# Patient Record
Sex: Female | Born: 1965 | Hispanic: No | Marital: Single | Smoking: Never smoker
Health system: Southern US, Community
[De-identification: ages and names within clinical notes are randomized; demographics above are authoritative.]

## PROBLEM LIST (undated history)

## (undated) DIAGNOSIS — E7800 Pure hypercholesterolemia, unspecified: Secondary | ICD-10-CM

## (undated) DIAGNOSIS — G43909 Migraine, unspecified, not intractable, without status migrainosus: Secondary | ICD-10-CM

## (undated) DIAGNOSIS — I10 Essential (primary) hypertension: Secondary | ICD-10-CM

## (undated) HISTORY — DX: Essential (primary) hypertension: I10

## (undated) HISTORY — DX: Pure hypercholesterolemia, unspecified: E78.00

## (undated) HISTORY — PX: TUBAL LIGATION: SHX77

---

## 1999-04-06 ENCOUNTER — Inpatient Hospital Stay (HOSPITAL_COMMUNITY): Admission: AD | Admit: 1999-04-06 | Discharge: 1999-04-06 | Payer: Self-pay | Admitting: Obstetrics

## 2009-07-12 ENCOUNTER — Emergency Department (HOSPITAL_COMMUNITY): Admission: EM | Admit: 2009-07-12 | Discharge: 2009-07-12 | Payer: Self-pay | Admitting: Emergency Medicine

## 2014-02-24 ENCOUNTER — Other Ambulatory Visit (HOSPITAL_COMMUNITY): Payer: Self-pay | Admitting: *Deleted

## 2014-02-24 DIAGNOSIS — N644 Mastodynia: Secondary | ICD-10-CM

## 2014-03-10 ENCOUNTER — Other Ambulatory Visit: Payer: Self-pay

## 2014-03-10 ENCOUNTER — Ambulatory Visit (HOSPITAL_COMMUNITY): Payer: Self-pay

## 2014-03-19 ENCOUNTER — Ambulatory Visit: Payer: Self-pay | Attending: Internal Medicine

## 2014-04-14 ENCOUNTER — Encounter (HOSPITAL_COMMUNITY): Payer: Self-pay

## 2014-04-14 ENCOUNTER — Ambulatory Visit (HOSPITAL_COMMUNITY)
Admission: RE | Admit: 2014-04-14 | Discharge: 2014-04-14 | Disposition: A | Discharge status: Home / Self Care | Payer: No Typology Code available for payment source | Source: Ambulatory Visit | Attending: Obstetrics and Gynecology | Admitting: Obstetrics and Gynecology

## 2014-04-14 ENCOUNTER — Ambulatory Visit
Admission: RE | Admit: 2014-04-14 | Discharge: 2014-04-14 | Disposition: A | Discharge status: Home / Self Care | Payer: No Typology Code available for payment source | Source: Ambulatory Visit | Attending: Obstetrics and Gynecology | Admitting: Obstetrics and Gynecology

## 2014-04-14 VITALS — BP 116/64 | Temp 98.4°F | Ht 62.0 in | Wt 150.8 lb

## 2014-04-14 DIAGNOSIS — N644 Mastodynia: Secondary | ICD-10-CM

## 2014-04-14 DIAGNOSIS — Z1239 Encounter for other screening for malignant neoplasm of breast: Secondary | ICD-10-CM

## 2014-04-14 HISTORY — DX: Migraine, unspecified, not intractable, without status migrainosus: G43.909

## 2014-04-14 NOTE — Progress Notes (Signed)
Complaints of left breast swelling and pain that comes and goes. Patient rated pain at a 5 out of 10.  Pap Smear: Pap smear not completed today. Last Pap smear was in October 2013 and normal per patient. Per patient has no history of an abnormal Pap smear. No Pap smear results in EPIC.  Physical exam: Breasts Left breast larger than right breast. Per patient her left breast has always been larger than her right breast. No skin abnormalities bilateral breasts. Bilateral nipple retraction per patient has always been that way. No nipple discharge bilateral breasts. No lymphadenopathy. No lumps palpated bilateral breasts. Complaints of left outer breast tenderness on exam. [facility]. Appointment scheduled for Tuesday, April 14, 2014 at 1115.    Pelvic/Bimanual No Pap smear completed today since last Pap smear was in October 2013 per patient. Pap smear not indicated per BCCCP guidelines.

## 2014-04-14 NOTE — Patient Instructions (Signed)
Explained to  Heidi Rogers that she did not need a Pap smear today due to last Pap smear was in October 2013 per patient. Let her know BCCCP will cover Pap smears every 3 years unless has a history of abnormal Pap smears. [facility] Appointment scheduled for Tuesday, April 14, 2014 at 1115. Patient aware of appointment and will be there. Heidi Baaslsa Y Rogers verbalized understanding.  [place], Kathaleen Maserhristine Poll, RN 11:02 AM

## 2014-04-16 ENCOUNTER — Ambulatory Visit (HOSPITAL_BASED_OUTPATIENT_CLINIC_OR_DEPARTMENT_OTHER): Payer: Self-pay

## 2014-04-16 ENCOUNTER — Other Ambulatory Visit: Payer: Self-pay

## 2014-04-16 VITALS — BP 120/72 | HR 66 | Temp 98.2°F | Resp 14 | Ht 59.0 in | Wt 156.0 lb

## 2014-04-16 DIAGNOSIS — Z0000 Encounter for general adult medical examination without abnormal findings: Secondary | ICD-10-CM

## 2014-04-16 LAB — LIPID PANEL
CHOLESTEROL: 194 mg/dL (ref 0–200)
HDL: 38 mg/dL — ABNORMAL LOW (ref >39)
LDL CALC: 129 mg/dL — AB (ref 0–99)
TRIGLYCERIDES: 137 mg/dL (ref <150)
Total CHOL/HDL Ratio: 5.1 Ratio
VLDL: 27 mg/dL (ref 0–40)

## 2014-04-16 LAB — HEMOGLOBIN A1C
Hgb A1c MFr Bld: 5.7 % — ABNORMAL HIGH (ref <5.7)
Mean Plasma Glucose: 117 mg/dL — ABNORMAL HIGH (ref <117)

## 2014-04-16 LAB — GLUCOSE (CC13): Glucose: 108 mg/dl (ref 70–140)

## 2014-04-16 NOTE — Progress Notes (Signed)
[facility] and is currently a B[facility]CP patient effective 04/14/2014 with an interpreter.   Clinical Measurements: Patient is 4 ft.11 inches, weight 156 lbs, waist circumference 35 inches, and hip circumference 39 inches.   Medical History: Patient has no history of high cholesterol. Patient does not have a history of hypertension or diabetes. Per patient no diagnosed history of coronary heart disease, heart attack, heart failure, stroke/TIA, vascular disease or congenital heart defects.   Blood Pressure, Self-measurement: Patient states has no reason to check Blood pressure.  Nutrition Assessment: Patient stated that eats 3 fruits every day. Patient states she eats one to 1 servings of vegetables a day. Per patient does not  eat 3 or more ounces of whole grains daily. Patient doesn't eat two or more servings of fish weekly. Patient states eats twice a month. Patient states she does not drink more than 36 ounces or 450 calories of beverages with added sugars weekly. Patient states she drinks a lot of water. Patient stated she does watch her salt intake.  Physical Activity Assessment: Patient stated she does around 730 minutes of moderate activity by walking and cleaning house per week. Per patient does not do vigorous exercise.  Smoking Status: Patient states has never smoked and is not around smoke.  Quality of Life Assessment: In assessing patient's quality of life she stated that out of the past 30 days that she has felt her health is good all of them. Patient also stated that in the past 30 days that her mental health was good including stress, depression and problems with emotions for all days. Patient did state that out of the past 30 days she felt her physical or mental health had not kept her from doing her usual activities including self-care, work or recreation.   Plan: Lab work will be done today including a lipid panel, blood glucose, and Hgb A1C.  Will call lab results when they are finished. Will discuss any follow up health coaching, community or life style program when called.

## 2014-04-16 NOTE — Patient Instructions (Signed)
Discussed health assessment with patient. Will decide on programs she wants when call lab results. Let patient know that will call her on Wednesday with lab results and [facility]. Informed not to let Health and wellness do any labs or procedures until goes to eligibility and get orange card or patient assistance. Patient verbalized understanding.

## 2014-04-17 ENCOUNTER — Telehealth: Payer: Self-pay

## 2014-04-17 NOTE — Telephone Encounter (Signed)
Call to give lab results and schedule for health coaching.

## 2014-04-29 ENCOUNTER — Telehealth: Payer: Self-pay

## 2014-04-29 NOTE — Telephone Encounter (Signed)
Called to inform about lab work from 02/27/14. Interpreter informed patient: cholesterol- 194, HDL- 38, LDL- 129, triglycerides - 161137, Bld Glucose -108 and HBG-A1C - 5.7.  Set up Health Coaching for 10/29 at 9 AM at [facility] for nutrition, HDL, LDL, and possible [facility].

## 2014-04-30 ENCOUNTER — Ambulatory Visit: Payer: Self-pay

## 2014-04-30 DIAGNOSIS — Z789 Other specified health status: Secondary | ICD-10-CM

## 2014-04-30 NOTE — Progress Notes (Signed)
Patient returns today for Health Coaching regarding Nutrition for her borderline AIC, HDL, LDL and activity with interpreter.   NUTRITION: Patient and I went over Know Your Health Numbers again.Patient reviewed A1C handout to see about prediabetes, normal and  diabetes range. Patient went over what prediabetes is, complications that can result if do not get levels to normal, and diabetes level. Discussed increasing fiber in diet, reading labels and serving sizes.  Discussed watching carbohydrates, how to count them, serving sizes and number of carbs per day allowance. Used plastic serving sizes to see portion sizes and what were starches, fruit and vegetables. Patient stated that she knew she was eating too much beans .  Patient went over 1,600 cal diet and we broke it down to the number of servings she could have. Patient received and reviewed the following handouts in Spanish: Carb counting Menu, A1C Exam, 8 ways to improve your cholesterol, and Diabetes Meal Plan Basics,   Gave patient measuring cup to measure serving sizes and demonstrated the serving sizes. Patient received cookbook in Spanish, lunch bag and bag.  ACTIVITY: Discussed activity and walking. Received and reviewed Exercise and Physical Activity [employer] book in Spanish.  Miscellaneous: Discussed that I would be following up with her 3 more times because of Health Coaching ([facility]), and would rescreen next year.  PLAN:  Increasing amount of walking per day and add other exercises to plan, Decrease carbohydrates in diet. Lose weight. Follow up times three per phone unless needs and can call.

## 2014-04-30 NOTE — Patient Instructions (Signed)
Patient will follow 1600 calorie diet plan. Will review all handouts and exercise/activity book. Will increase exercise. Will measure portion sizes. Will call if has any questions. Will call patient in three weeks. Patient verbalized understanding. 

## 2014-05-04 ENCOUNTER — Encounter (HOSPITAL_COMMUNITY): Payer: Self-pay

## 2014-06-01 ENCOUNTER — Encounter: Payer: Self-pay | Admitting: Internal Medicine

## 2014-06-01 ENCOUNTER — Ambulatory Visit: Payer: Self-pay | Attending: Internal Medicine | Admitting: Internal Medicine

## 2014-06-01 ENCOUNTER — Ambulatory Visit (HOSPITAL_BASED_OUTPATIENT_CLINIC_OR_DEPARTMENT_OTHER): Payer: Self-pay

## 2014-06-01 VITALS — BP 126/82 | HR 75 | Temp 98.0°F | Resp 16 | Wt 143.4 lb

## 2014-06-01 DIAGNOSIS — Z23 Encounter for immunization: Secondary | ICD-10-CM | POA: Insufficient documentation

## 2014-06-01 DIAGNOSIS — Z139 Encounter for screening, unspecified: Secondary | ICD-10-CM

## 2014-06-01 DIAGNOSIS — R739 Hyperglycemia, unspecified: Secondary | ICD-10-CM | POA: Insufficient documentation

## 2014-06-01 DIAGNOSIS — R7309 Other abnormal glucose: Secondary | ICD-10-CM

## 2014-06-01 DIAGNOSIS — G43909 Migraine, unspecified, not intractable, without status migrainosus: Secondary | ICD-10-CM | POA: Insufficient documentation

## 2014-06-01 DIAGNOSIS — G43809 Other migraine, not intractable, without status migrainosus: Secondary | ICD-10-CM | POA: Insufficient documentation

## 2014-06-01 LAB — CBC WITH DIFFERENTIAL/PLATELET
BASOS ABS: 0.1 10*3/uL (ref 0.0–0.1)
Basophils Relative: 1 % (ref 0–1)
EOS ABS: 0.2 10*3/uL (ref 0.0–0.7)
Eosinophils Relative: 2 % (ref 0–5)
HEMATOCRIT: 40.4 % (ref 36.0–46.0)
Hemoglobin: 13.7 g/dL (ref 12.0–15.0)
LYMPHS ABS: 2.1 10*3/uL (ref 0.7–4.0)
LYMPHS PCT: 26 % (ref 12–46)
MCH: 27.9 pg (ref 26.0–34.0)
MCHC: 33.9 g/dL (ref 30.0–36.0)
MCV: 82.3 fL (ref 78.0–100.0)
MONOS PCT: 8 % (ref 3–12)
MPV: 11.7 fL (ref 9.4–12.4)
Monocytes Absolute: 0.7 10*3/uL (ref 0.1–1.0)
NEUTROS PCT: 63 % (ref 43–77)
Neutro Abs: 5.2 10*3/uL (ref 1.7–7.7)
Platelets: 249 10*3/uL (ref 150–400)
RBC: 4.91 MIL/uL (ref 3.87–5.11)
RDW: 13.6 % (ref 11.5–15.5)
WBC: 8.2 10*3/uL (ref 4.0–10.5)

## 2014-06-01 LAB — GLUCOSE, POCT (MANUAL RESULT ENTRY): POC Glucose: 115 mg/dL — AB (ref 70–99)

## 2014-06-01 MED ORDER — SUMATRIPTAN SUCCINATE 50 MG PO TABS: 50.0000 mg | ORAL_TABLET | ORAL | Status: DC | PRN

## 2014-06-01 NOTE — Progress Notes (Signed)
Interpreter line used Patient here to establish care Complains of having headaches that when she gets them cause her tongue to be numb And some numbness to her right hand Recently seen in the ED and was told her sugar was high Today blood sugar is 115

## 2014-06-01 NOTE — Patient Instructions (Signed)
Fat and Cholesterol Control Diet Fat and cholesterol levels in your blood and organs are influenced by your diet. High levels of fat and cholesterol may lead to diseases of the heart, small and large blood vessels, gallbladder, liver, and pancreas. CONTROLLING FAT AND CHOLESTEROL WITH DIET Although exercise and lifestyle factors are important, your diet is key. That is because certain foods are known to raise cholesterol and others to lower it. The goal is to balance foods for their effect on cholesterol and more importantly, to replace saturated and trans fat with other types of fat, such as monounsaturated fat, polyunsaturated fat, and omega-3 fatty acids. On average, a person should consume no more than 15 to 17 g of saturated fat daily. Saturated and trans fats are considered "bad" fats, and they will raise LDL cholesterol. Saturated fats are primarily found in animal products such as meats, butter, and cream. However, that does not mean you need to give up all your favorite foods. Today, there are good tasting, low-fat, low-cholesterol substitutes for most of the things you like to eat. Choose low-fat or nonfat alternatives. Choose round or loin cuts of red meat. These types of cuts are lowest in fat and cholesterol. Chicken (without the skin), fish, veal, and ground turkey breast are great choices. Eliminate fatty meats, such as hot dogs and salami. Even shellfish have little or no saturated fat. Have a 3 oz (85 g) portion when you eat lean meat, poultry, or fish. Trans fats are also called "partially hydrogenated oils." They are oils that have been scientifically manipulated so that they are solid at room temperature resulting in a longer shelf life and improved taste and texture of foods in which they are added. Trans fats are found in stick margarine, some tub margarines, cookies, crackers, and baked goods.  When baking and cooking, oils are a great substitute for butter. The monounsaturated oils are  especially beneficial since it is believed they lower LDL and raise HDL. The oils you should avoid entirely are saturated tropical oils, such as coconut and palm.  Remember to eat a lot from food groups that are naturally free of saturated and trans fat, including fish, fruit, vegetables, beans, grains (barley, rice, couscous, bulgur wheat), and pasta (without cream sauces).  IDENTIFYING FOODS THAT LOWER FAT AND CHOLESTEROL  Soluble fiber may lower your cholesterol. This type of fiber is found in fruits such as apples, vegetables such as broccoli, potatoes, and carrots, legumes such as beans, peas, and lentils, and grains such as barley. Foods fortified with plant sterols (phytosterol) may also lower cholesterol. You should eat at least 2 g per day of these foods for a cholesterol lowering effect.  Read package labels to identify low-saturated fats, trans fat free, and low-fat foods at the supermarket. Select cheeses that have only 2 to 3 g saturated fat per ounce. Use a heart-healthy tub margarine that is free of trans fats or partially hydrogenated oil. When buying baked goods (cookies, crackers), avoid partially hydrogenated oils. Breads and muffins should be made from whole grains (whole-wheat or whole oat flour, instead of "flour" or "enriched flour"). Buy non-creamy canned soups with reduced salt and no added fats.  FOOD PREPARATION TECHNIQUES  Never deep-fry. If you must fry, either stir-fry, which uses very little fat, or use non-stick cooking sprays. When possible, broil, bake, or roast meats, and steam vegetables. Instead of putting butter or margarine on vegetables, use lemon and herbs, applesauce, and cinnamon (for squash and sweet potatoes). Use nonfat   yogurt, salsa, and low-fat dressings for salads.  LOW-SATURATED FAT / LOW-FAT FOOD SUBSTITUTES Meats / Saturated Fat (g)  Avoid: Steak, marbled (3 oz/85 g) / 11 g  Choose: Steak, lean (3 oz/85 g) / 4 g  Avoid: Hamburger (3 oz/85 g) / 7  g  Choose: Hamburger, lean (3 oz/85 g) / 5 g  Avoid: Ham (3 oz/85 g) / 6 g  Choose: Ham, lean cut (3 oz/85 g) / 2.4 g  Avoid: Chicken, with skin, dark meat (3 oz/85 g) / 4 g  Choose: Chicken, skin removed, dark meat (3 oz/85 g) / 2 g  Avoid: Chicken, with skin, light meat (3 oz/85 g) / 2.5 g  Choose: Chicken, skin removed, light meat (3 oz/85 g) / 1 g Dairy / Saturated Fat (g)  Avoid: Whole milk (1 cup) / 5 g  Choose: Low-fat milk, 2% (1 cup) / 3 g  Choose: Low-fat milk, 1% (1 cup) / 1.5 g  Choose: Skim milk (1 cup) / 0.3 g  Avoid: Hard cheese (1 oz/28 g) / 6 g  Choose: Skim milk cheese (1 oz/28 g) / 2 to 3 g  Avoid: Cottage cheese, 4% fat (1 cup) / 6.5 g  Choose: Low-fat cottage cheese, 1% fat (1 cup) / 1.5 g  Avoid: Ice cream (1 cup) / 9 g  Choose: Sherbet (1 cup) / 2.5 g  Choose: Nonfat frozen yogurt (1 cup) / 0.3 g  Choose: Frozen fruit bar / trace  Avoid: Whipped cream (1 tbs) / 3.5 g  Choose: Nondairy whipped topping (1 tbs) / 1 g Condiments / Saturated Fat (g)  Avoid: Mayonnaise (1 tbs) / 2 g  Choose: Low-fat mayonnaise (1 tbs) / 1 g  Avoid: Butter (1 tbs) / 7 g  Choose: Extra light margarine (1 tbs) / 1 g  Avoid: Coconut oil (1 tbs) / 11.8 g  Choose: Olive oil (1 tbs) / 1.8 g  Choose: Corn oil (1 tbs) / 1.7 g  Choose: Safflower oil (1 tbs) / 1.2 g  Choose: Sunflower oil (1 tbs) / 1.4 g  Choose: Soybean oil (1 tbs) / 2.4 g  Choose: Canola oil (1 tbs) / 1 g Document Released: 06/19/2005 Document Revised: 10/14/2012 Document Reviewed: 09/17/2013 ExitCare Patient Information 2015 [place], [employer]. This information is not intended to replace advice given to you by your health care provider. Make sure you discuss any questions you have with your health care provider. La diabetes mellitus y los alimentos (Diabetes Mellitus and Food) Es importante que controle su nivel de azcar en la sangre (glucosa). El nivel de glucosa en sangre depende en gran  medida de lo que usted come. Comer alimentos saludables en las cantidades [place]adecuadas a lo largo del Futures traderda, aproximadamente a la misma hora CarMaxtodos los das, lo ayudar a Chief Operating Officercontrolar su nivel de Event organiserglucosa en sangre. Tambin puede ayudarlo a retrasar o Fish farm managerevitar el empeoramiento de la diabetes mellitus. Comer de Regions Financial Corporationmanera saludable incluso puede ayudarlo a Event organisermejorar el nivel de presin arterial y a Baristaalcanzar o Pharmacologistmantener un peso saludable.  CMO PUEDEN AFECTARME LOS ALIMENTOS? Carbohidratos Los carbohidratos afectan el nivel de glucosa en sangre ms que cualquier otro tipo de alimento. El nutricionista lo ayudar a Chief Strategy Officerdeterminar cuntos carbohidratos puede consumir en cada comida y ensearle a contarlos. El recuento de carbohidratos es importante para mantener la glucosa en sangre en un nivel saludable, en especial si utiliza insulina o toma determinados medicamentos para la diabetes mellitus. Alcohol El alcohol puede provocar disminuciones sbitas de  la glucosa en sangre (hipoglucemia), en especial si utiliza insulina o toma determinados medicamentos para la diabetes mellitus. La hipoglucemia es una afeccin que puede poner en peligro la vida. Los sntomas de la hipoglucemia (somnolencia, mareos y Administratordesorientacin) son similares a los sntomas de haber consumido mucho alcohol.  Si el mdico lo autoriza a beber alcohol, hgalo con moderacin y siga estas pautas:  Las mujeres no deben beber ms de un trago por da, y los hombres no deben beber ms de dos tragos por Futures traderda. Un trago es igual a:  12 onzas (355 ml) de cerveza  5 onzas de vino (150 ml) de vino  1,5onzas (45ml) de bebidas espirituosas  No beba con el estmago vaco.  Mantngase hidratado. Beba agua, gaseosas dietticas o t helado sin azcar.  Las gaseosas comunes, los jugos y otros refrescos podran contener muchos carbohidratos y se Heritage managerdeben contar. QU ALIMENTOS NO SE RECOMIENDAN? Cuando haga las elecciones de alimentos, es importante que recuerde que todos  los alimentos son distintos. Algunos tienen menos nutrientes que otros por porcin, aunque podran tener la misma cantidad de caloras o carbohidratos. Es difcil darle al cuerpo lo que necesita cuando consume alimentos con menos nutrientes. Estos son algunos ejemplos de alimentos que debera evitar ya que contienen muchas caloras y carbohidratos, pero pocos nutrientes:  NeurosurgeonGrasas trans (la mayora de los alimentos procesados incluyen grasas trans en la etiqueta de Informacin nutricional).  Gaseosas comunes.  Jugos.  Caramelos.  Dulces, como tortas, pasteles, rosquillas y Graygalletas.  Comidas fritas. QU ALIMENTOS PUEDO COMER? Consuma alimentos ricos en nutrientes, que nutrirn el cuerpo y lo mantendrn saludable. Los alimentos que debe comer tambin dependern de varios factores, como:  Las caloras que necesita.  Los medicamentos que toma.  Su peso.  El nivel de glucosa en Metzgersangre.  El [place] Springsnivel de presin arterial.  El nivel de colesterol. Tambin debe consumir una variedad de [place]alimentos, como:  Protenas, como carne, aves, pescado, tofu, frutos secos y semillas (las protenas de Boardmananimales magros son mejores).  Nils PyleFrutas.  Verduras.  Productos lcteos, como [place]le[place], queso y yogur (descremados son mejores).  Panes, granos, pastas, cereales, arroz y frijoles.  Grasas, como aceite de Padenoliva, Indiamargarina sin grasas trans, aceite de canola, aguacate y Toppenishaceitunas. TODOS LOS QUE PADECEN DIABETES MELLITUS TIENEN EL MISMO PLAN DE COMIDAS? Dado que todas las personas que padecen diabetes mellitus son distintas, no hay un solo plan de comidas que funcione para todos. Es muy importante que se rena con un nutricionista que lo ayudar a crear un plan de comidas adecuado para usted. Document Released: 09/26/2007 Document Revised: 06/24/2013 [facility] Patient Information 2015 [place], [place]. This information is not intended to replace advice given to you by your health care provider. Make sure you  discuss any questions you have with your health care provider.

## 2014-06-01 NOTE — Progress Notes (Signed)
Patient Demographics  Heidi Rogers, is a 48 y.o. female  AOZ:[id number]  [id number]  DOB - [birth year]  CC:  Chief Complaint  Patient presents with  . Establish Care       HPI: Heidi Rogers is a 48 y.o. female here today to establish medical care.chief history of migraine headaches as per patient she has been taking Excedrin Migraine and she took last night typically , the episode typically lasts for 1-2 hours which is associated with nausea and vomiting, she currently denies any headache dizziness chest and shortness of breath, denies smoking cigarettes, alcohol or illicit drugs,patient is up-to-date with her mammogram had a Pap smear done last year, recently she had blood work done and was told her sugar is elevated, noticed hemoglobin A1c of 5.7%. Patient denies any family history of diabetes. Patient has No headache, No chest pain, No abdominal pain - No Nausea, No new weakness tingling or numbness, No Cough - SOB.  No Known Allergies Past Medical History  Diagnosis Date  . Migraines    No current outpatient prescriptions on file prior to visit.   No current facility-administered medications on file prior to visit.   Family History  Problem Relation Age of Onset  . Migraines     History   Social History  . Marital Status: Single    Spouse Name: N/A    Number of Children: N/A  . Years of Education: N/A   Occupational History  . Not on file.   Social History Main Topics  . Smoking status: Never Smoker   . Smokeless tobacco: Never Used  . Alcohol Use: No  . Drug Use: No  . Sexual Activity: Yes    Birth Control/ Protection: Surgical   Other Topics Concern  . Not on file   Social History Narrative    Review of Systems: Constitutional: Negative for fever, chills, diaphoresis, activity change, appetite change and fatigue. HENT: Negative for ear pain, nosebleeds, congestion, facial swelling, rhinorrhea, neck pain, neck stiffness and ear discharge.  Eyes:  Negative for pain, discharge, redness, itching and visual disturbance. Respiratory: Negative for cough, choking, chest tightness, shortness of breath, wheezing and stridor.  Cardiovascular: Negative for chest pain, palpitations and leg swelling. Gastrointestinal: Negative for abdominal distention. Genitourinary: Negative for dysuria, urgency, frequency, hematuria, flank pain, decreased urine volume, difficulty urinating and dyspareunia.  Musculoskeletal: Negative for back pain, joint swelling, arthralgia and gait problem. Neurological: Negative for dizziness, tremors, seizures, syncope, facial asymmetry, speech difficulty, weakness, light-headedness, numbness and headaches.  Hematological: Negative for adenopathy. Does not bruise/bleed easily. Psychiatric/Behavioral: Negative for hallucinations, behavioral problems, confusion, dysphoric mood, decreased concentration and agitation.    Objective:   Filed Vitals:   06/01/14 1437  BP: 126/82  Pulse: 75  Temp: 98 F (36.7 C)  Resp: 16    Physical Exam: Constitutional: Patient appears well-developed and well-nourished. No distress. HENT: Normocephalic, atraumatic, External right and left ear normal. Oropharynx is clear and moist.  Eyes: Conjunctivae and EOM are normal. PERRLA, no scleral icterus. Neck: Normal ROM. Neck supple. No JVD. No tracheal deviation. No thyromegaly. CVS: RRR, S1/S2 +, no murmurs, no gallops, no carotid bruit.  Pulmonary: Effort and breath sounds normal, no stridor, rhonchi, wheezes, rales.  Abdominal: Soft. BS +, no distension, tenderness, rebound or guarding.  Musculoskeletal: Normal range of motion. No edema and no tenderness.  Neuro: Alert. Normal reflexes, muscle tone coordination. No cranial nerve deficit. Skin: Skin is warm and dry. No rash noted. Not diaphoretic. No  erythema. No pallor. Psychiatric: Normal mood and affect. Behavior, judgment, thought content normal.  No results found for: WBC, HGB, HCT,  MCV, PLT No results found for: CREATININE, BUN, NA, K, CL, CO2  Lab Results  Component Value Date   HGBA1C 5.7* 04/16/2014   Lipid Panel     Component Value Date/Time   CHOL 194 04/16/2014 0925   TRIG 137 04/16/2014 0925   HDL 38* 04/16/2014 0925   CHOLHDL 5.1 04/16/2014 0925   VLDL 27 04/16/2014 0925   LDLCALC 129* 04/16/2014 0925       Assessment and plan:   1. Elevated blood sugar Her recent hemoglobin A1c was 5.7%, I have advised patient for low carbohydrate diet. - Glucose (CBG)  2. Other migraine without status migrainosus, not intractable Patient will continue taking Excedrin Migraine I have prescribed Imitrex to use when necessary for severe headache - SUMAtriptan (IMITREX) 50 MG tablet; Take 1 tablet (50 mg total) by mouth every 2 (two) hours as needed for migraine or headache. May repeat in 2 hours if headache persists or recurs.  Dispense: 10 tablet; Refill: 0  3. Screening Ordered baseline blood work. - CBC with Differential - TSH - Vit D  25 hydroxy (rtn osteoporosis monitoring) - COMPLETE METABOLIC PANEL WITH GFR  4. Needs flu shot Flu shot given today.     Health Maintenance  -Pap Smear: had one last year  -Mammogram: uptodate  -Vaccinations:  Flu shot today   Return in about 3 months (around 08/31/2014) for migraine.  Doris CheadleADVANI, Lenea [place], MD

## 2014-06-02 LAB — COMPLETE METABOLIC PANEL WITH GFR
ALK PHOS: 56 U/L (ref 39–117)
ALT: 12 U/L (ref 0–35)
AST: 12 U/L (ref 0–37)
Albumin: 4.2 g/dL (ref 3.5–5.2)
BUN: 12 mg/dL (ref 6–23)
CO2: 25 meq/L (ref 19–32)
Calcium: 8.8 mg/dL (ref 8.4–10.5)
Chloride: 105 mEq/L (ref 96–112)
Creat: 0.63 mg/dL (ref 0.50–1.10)
GFR, Est African American: >89 mL/min
Glucose, Bld: 99 mg/dL (ref 70–99)
POTASSIUM: 3.8 meq/L (ref 3.5–5.3)
SODIUM: 136 meq/L (ref 135–145)
Total Bilirubin: 0.3 mg/dL (ref 0.2–1.2)
Total Protein: 6.6 g/dL (ref 6.0–8.3)

## 2014-06-02 LAB — VITAMIN D 25 HYDROXY (VIT D DEFICIENCY, FRACTURES): Vit D, 25-Hydroxy: 27 ng/mL — ABNORMAL LOW (ref 30–100)

## 2014-06-02 LAB — TSH: TSH: 2.035 u[IU]/mL (ref 0.350–4.500)

## 2014-06-04 ENCOUNTER — Telehealth: Payer: Self-pay | Admitting: *Deleted

## 2014-06-04 NOTE — Telephone Encounter (Signed)
-----   Message from Doris Cheadleeepak Advani, MD sent at 06/02/2014  1:33 PM EST ----- Blood work reviewed, noticed low vitamin D, advise patient to start taking OTC 2000 units daily.

## 2014-06-04 NOTE — Telephone Encounter (Signed)
Left message to return call 

## 2014-06-05 ENCOUNTER — Telehealth: Payer: Self-pay | Admitting: Internal Medicine

## 2014-06-05 NOTE — Telephone Encounter (Signed)
Pt. Calling to request results, please f/u with pt.

## 2014-11-09 ENCOUNTER — Telehealth: Payer: Self-pay

## 2014-11-09 NOTE — Telephone Encounter (Signed)
Left message. Will call back.

## 2015-01-21 ENCOUNTER — Telehealth: Payer: Self-pay

## 2015-01-22 NOTE — Telephone Encounter (Signed)
Left message that had called and would call again.

## 2017-06-08 ENCOUNTER — Other Ambulatory Visit: Payer: Self-pay

## 2017-06-08 ENCOUNTER — Encounter (HOSPITAL_COMMUNITY): Payer: Self-pay | Admitting: Emergency Medicine

## 2017-06-08 ENCOUNTER — Emergency Department (HOSPITAL_COMMUNITY)
Admission: EM | Admit: 2017-06-08 | Discharge: 2017-06-08 | Disposition: A | Discharge status: Home / Self Care | Payer: Self-pay | Attending: Emergency Medicine | Admitting: Emergency Medicine

## 2017-06-08 DIAGNOSIS — R51 Headache: Secondary | ICD-10-CM | POA: Insufficient documentation

## 2017-06-08 DIAGNOSIS — R519 Headache, unspecified: Secondary | ICD-10-CM

## 2017-06-08 LAB — COMPREHENSIVE METABOLIC PANEL
ALK PHOS: 65 U/L (ref 38–126)
ALT: 16 U/L (ref 14–54)
AST: 17 U/L (ref 15–41)
Albumin: 4.1 g/dL (ref 3.5–5.0)
Anion gap: 6 (ref 5–15)
BUN: 10 mg/dL (ref 6–20)
CALCIUM: 8.6 mg/dL — AB (ref 8.9–10.3)
CHLORIDE: 109 mmol/L (ref 101–111)
CO2: 24 mmol/L (ref 22–32)
CREATININE: 0.45 mg/dL (ref 0.44–1.00)
Glucose, Bld: 134 mg/dL — ABNORMAL HIGH (ref 65–99)
Potassium: 3.8 mmol/L (ref 3.5–5.1)
Sodium: 139 mmol/L (ref 135–145)
Total Bilirubin: 0.7 mg/dL (ref 0.3–1.2)
Total Protein: 7.1 g/dL (ref 6.5–8.1)

## 2017-06-08 LAB — CBC WITH DIFFERENTIAL/PLATELET
Basophils Absolute: 0 10*3/uL (ref 0.0–0.1)
Basophils Relative: 0 %
EOS PCT: 0 %
Eosinophils Absolute: 0 10*3/uL (ref 0.0–0.7)
HCT: 41.9 % (ref 36.0–46.0)
Hemoglobin: 14.2 g/dL (ref 12.0–15.0)
LYMPHS ABS: 1.2 10*3/uL (ref 0.7–4.0)
LYMPHS PCT: 9 %
MCH: 29 pg (ref 26.0–34.0)
MCHC: 33.9 g/dL (ref 30.0–36.0)
MCV: 85.7 fL (ref 78.0–100.0)
MONOS PCT: 4 %
Monocytes Absolute: 0.5 10*3/uL (ref 0.1–1.0)
Neutro Abs: 12.4 10*3/uL — ABNORMAL HIGH (ref 1.7–7.7)
Neutrophils Relative %: 87 %
PLATELETS: 231 10*3/uL (ref 150–400)
RBC: 4.89 MIL/uL (ref 3.87–5.11)
RDW: 12.5 % (ref 11.5–15.5)
WBC: 14.2 10*3/uL — AB (ref 4.0–10.5)

## 2017-06-08 LAB — I-STAT CG4 LACTIC ACID, ED: Lactic Acid, Venous: 0.56 mmol/L (ref 0.5–1.9)

## 2017-06-08 LAB — I-STAT BETA HCG BLOOD, ED (MC, WL, AP ONLY)

## 2017-06-08 MED ORDER — IBUPROFEN 600 MG PO TABS: 600.0000 mg | ORAL_TABLET | Freq: Four times a day (QID) | ORAL | 0 refills | Status: DC | PRN

## 2017-06-08 MED ORDER — ONDANSETRON 4 MG PO TBDP
4.0000 mg | ORAL_TABLET | Freq: Once | ORAL | Status: AC
  Administered 2017-06-08: 4 mg via ORAL
  Filled 2017-06-08: qty 1

## 2017-06-08 MED ORDER — MECLIZINE HCL 25 MG PO TABS
12.5000 mg | ORAL_TABLET | Freq: Once | ORAL | Status: AC
  Administered 2017-06-08: 12.5 mg via ORAL
  Filled 2017-06-08: qty 1

## 2017-06-08 MED ORDER — DIPHENHYDRAMINE HCL 50 MG/ML IJ SOLN
25.0000 mg | Freq: Once | INTRAMUSCULAR | Status: AC
  Administered 2017-06-08: 25 mg via INTRAVENOUS
  Filled 2017-06-08: qty 1

## 2017-06-08 MED ORDER — SODIUM CHLORIDE 0.9 % IV BOLUS (SEPSIS)
1000.0000 mL | Freq: Once | INTRAVENOUS | Status: AC
  Administered 2017-06-08: 1000 mL via INTRAVENOUS

## 2017-06-08 MED ORDER — PROCHLORPERAZINE EDISYLATE 5 MG/ML IJ SOLN
10.0000 mg | Freq: Once | INTRAMUSCULAR | Status: AC
Start: 2017-06-08 — End: 2017-06-08
  Administered 2017-06-08: 10 mg via INTRAVENOUS
  Filled 2017-06-08: qty 2

## 2017-06-08 MED ORDER — KETOROLAC TROMETHAMINE 15 MG/ML IJ SOLN
15.0000 mg | Freq: Once | INTRAMUSCULAR | Status: AC
  Administered 2017-06-08: 15 mg via INTRAVENOUS
  Filled 2017-06-08: qty 1

## 2017-06-08 MED ORDER — ACETAMINOPHEN ER 650 MG PO TBCR: 650.0000 mg | EXTENDED_RELEASE_TABLET | Freq: Three times a day (TID) | ORAL | 0 refills | Status: DC | PRN

## 2017-06-08 NOTE — ED Provider Notes (Signed)
[facility]-EMERGENCY DEPT Provider Note   CSN: [id number] Arrival date & time: 06/08/17  9562     History   Chief Complaint Chief Complaint  Patient presents with  . Headache    HPI Heidi Rogers is a 51 y.o. female.  HPI   [name] is a 51 year old female with a history of migraines, elevated blood sugar who presents emergency department for evaluation of headache and vomiting.  Patient is Spanish-speaking, Cub[place] interpreter used during the encounter.  She states that she has had a headache for the past 4 days now which has gradually worsened.  Headache is located on the top of the head and feels like pressure, "like my head is going to explode."  Pain is 8/10 in severity and worsened with bright lights and loud noises.  Patient states that she has taken ibuprofen without adequate relief.  She reports associated nausea and vomiting for the past 3 days.  She also reports intermittent chills, no fever that she is aware of.  Denies visual disturbance, numbness, weakness, neck pain, cough, abdominal pain, dysuria, urinary frequency, body aches, congestion, sore throat.  Denies recent head trauma.  States that she generally has 2 migraines a month, but this is different because it is lasted longer and has not been improved with ibuprofen.  Past Medical History:  Diagnosis Date  . Migraines     Patient Active Problem List   Diagnosis Date Noted  . Other migraine without status migrainosus, not intractable 06/01/2014  . Elevated blood sugar 06/01/2014  . Pain of left breast 04/14/2014    Past Surgical History:  Procedure Laterality Date  . TUBAL LIGATION      OB History    Gravida Para Term Preterm AB Living   5 5 5     5    SAB TAB Ectopic Multiple Live Births                   Home Medications    Prior to Admission medications   Medication Sig Start Date End Date Taking? Authorizing Provider  ibuprofen (ADVIL,MOTRIN) 600 MG tablet Take 600 mg by  mouth every 6 (six) hours as needed for moderate pain.   Yes [provider]  SUMAtriptan (IMITREX) 50 MG tablet Take 1 tablet (50 mg total) by mouth every 2 (two) hours as needed for migraine or headache. May repeat in 2 hours if headache persists or recurs. 06/01/14   Doris Cheadle, MD    Family History Family History  Problem Relation Age of Onset  . Migraines Unknown     Social History Social History   Tobacco Use  . Smoking status: Never Smoker  . Smokeless tobacco: Never Used  Substance Use Topics  . Alcohol use: No  . Drug use: No     Allergies   Patient has no known allergies.   Review of Systems Review of Systems  Constitutional: Positive for chills. Negative for fatigue and fever.  HENT: Negative for congestion and sore throat.   Eyes: Positive for photophobia. Negative for visual disturbance.  Respiratory: Negative for cough and shortness of breath.   Cardiovascular: Negative for chest pain.  Gastrointestinal: Positive for nausea and vomiting. Negative for abdominal pain.  Genitourinary: Negative for difficulty urinating, dysuria, flank pain and frequency.  Musculoskeletal: Negative for myalgias, neck pain and neck stiffness.  Skin: Negative for rash.  Neurological: Positive for headaches. Negative for dizziness, weakness, light-headedness and numbness.  Psychiatric/Behavioral: Negative for agitation.  Physical Exam Updated Vital Signs BP 138/86 (BP Location: Left Arm)   Pulse 85   Temp 98.6 F (37 C) (Oral)   Resp 18   LMP 05/06/2017   SpO2 96%   Physical Exam  Constitutional: She appears well-developed and well-nourished. No distress.  HENT:  Head: Normocephalic and atraumatic.  Mouth/Throat: Oropharynx is clear and moist.  Eyes: Pupils are equal, round, and reactive to light. Right eye exhibits no discharge. Left eye exhibits no discharge. Right eye exhibits no nystagmus. Left eye exhibits no nystagmus.  Neck: Normal range of  motion. Neck supple.  Negative Brudzinski.  Cardiovascular: Normal rate, regular rhythm and intact distal pulses.  No murmur heard. Pulmonary/Chest: Effort normal and breath sounds normal. No stridor. No respiratory distress. She has no wheezes. She has no rales. She exhibits no tenderness.  Abdominal: Soft. Bowel sounds are normal. She exhibits no distension. There is no tenderness. There is no guarding.  Musculoskeletal: Normal range of motion.  Lymphadenopathy:    She has no cervical adenopathy.  Neurological: She is alert. Coordination normal.  Mental Status:  Alert, oriented, thought content appropriate, able to give a coherent history. Speech fluent without evidence of aphasia. Able to follow 2 step commands without difficulty.  Cranial Nerves:  II:  Peripheral visual fields grossly normal, pupils equal, round, reactive to light III,IV, VI: ptosis not present, extra-ocular motions intact bilaterally  V,VII: smile symmetric, facial light touch sensation equal VIII: hearing grossly normal to voice  X: uvula elevates symmetrically  XI: bilateral shoulder shrug symmetric and strong XII: midline tongue extension without fassiculations Motor:  Normal tone. 5/5 in upper and lower extremities bilaterally including strong and equal grip strength and dorsiflexion/plantar flexion Sensory: Pinprick and light touch normal in all extremities.  Deep Tendon Reflexes: 2+ and symmetric in the biceps and patella Cerebellar: normal finger-to-nose with bilateral upper extremities Gait: normal gait and balance  Skin: Skin is warm and dry. Capillary refill takes less than 2 seconds. She is not diaphoretic.  Psychiatric: She has a normal mood and affect. Her behavior is normal.  Nursing note and vitals reviewed.    ED Treatments / Results  Labs (all labs ordered are listed, but only abnormal results are displayed) Labs Reviewed  CBC WITH DIFFERENTIAL/PLATELET - Abnormal; Notable for the following  components:      Result Value   WBC 14.2 (*)    Neutro Abs 12.4 (*)    All other components within normal limits  COMPREHENSIVE METABOLIC PANEL - Abnormal; Notable for the following components:   Glucose, Bld 134 (*)    Calcium 8.6 (*)    All other components within normal limits  I-STAT BETA HCG BLOOD, ED ([facility], [facility], [facility] ONLY)  I-STAT CG4 LACTIC ACID, ED  I-STAT CG4 LACTIC ACID, ED    EKG  EKG Interpretation None       Radiology No results found.  Procedures Procedures (including critical care time)  Medications Ordered in ED Medications  ketorolac (TORADOL) 15 MG/ML injection 15 mg (not administered)  prochlorperazine (COMPAZINE) injection 10 mg (not administered)  diphenhydrAMINE (BENADRYL) injection 25 mg (not administered)  sodium chloride 0.9 % bolus 1,000 mL (not administered)  ondansetron (ZOFRAN-ODT) disintegrating tablet 4 mg (4 mg Oral Given 06/08/17 0529)     Initial Impression / Assessment and Plan / ED Course  I have reviewed the triage vital signs and the nursing notes.  Pertinent labs & imaging results that were available during my care of the patient were  reviewed by me and considered in my medical decision making (see chart for details).  Clinical Course as of Jun 08 918  Fri Jun 08, 2017  0908 On recheck, patient states that her headache is improved but has a sensation of room spinning which began after receiving Toradol, Compazine and Benadryl.  Will give her meclizine and recheck.  [ES]    Clinical Course User Index [ES] Kellie ShropshireShrosbree, Emily J, PA-C   Presents with ongoing headache for the past 4 days and associated nausea/vomiting, photophobia and phonophobia.  She has a history of migraines in the past.  She is nontoxic-appearing and in no apparent distress.  Her vital signs are stable.  She has no fever or neck pain/stiffness to suggest meningitis.  No focal neurological deficits, exam non concerning for ICH, SAH.   Basic labs reviewed.  Beta hCG  negative.  CMP unremarkable. CBC shows mild leukocytosis with WBC 14.2.  Lactic acid normal.  Do not suspect infectious etiology given presentation.  Her headache improved in the ER.  She is able to tolerate p.o. fluids at the bedside.  She has no complaints prior to discharge.  Her glucose was elevated, counseled her to have this rechecked primary care office and gave her information to establish care at [facility].  Discussed return precautions the patient agrees and voiced understanding.  Discussed this patient with Dr. Silverio LayYao who agrees with the above plan.   Final Clinical Impressions(s) / ED Diagnoses   Final diagnoses:  Acute nonintractable headache, unspecified headache type    ED Discharge Orders    None       Kellie ShropshireShrosbree, Emily J, PA-C 06/08/17 1619    Charlynne PanderYao, David Hsienta, MD 06/09/17 [id number]

## 2017-06-08 NOTE — ED Triage Notes (Signed)
Pt complaint of headache, vomiting, and possible fever ongoing for 4 days. Mask applied with triage.

## 2017-06-08 NOTE — Discharge Instructions (Signed)
You can take Tylenol or ibuprofen as needed for headache in the future.  Your blood sugar was elevated in the ER today, please have this rechecked at primary care office.  I have listed the information to [facility], which is a clinic located across the street from [facility].  The extent patients he did not have insurance.  Please call to establish care and for follow-up.  Return to the emergency department if you have headache with neck pain and fever greater than 100.76F, headache with vomiting that does not stop, headache with numbness in the arms or legs.

## 2018-05-04 ENCOUNTER — Emergency Department (HOSPITAL_COMMUNITY)
Admission: EM | Admit: 2018-05-04 | Discharge: 2018-05-04 | Disposition: A | Discharge status: Home / Self Care | Payer: Self-pay | Attending: Emergency Medicine | Admitting: Emergency Medicine

## 2018-05-04 ENCOUNTER — Emergency Department (HOSPITAL_COMMUNITY): Payer: Self-pay

## 2018-05-04 ENCOUNTER — Other Ambulatory Visit: Payer: Self-pay

## 2018-05-04 ENCOUNTER — Encounter (HOSPITAL_COMMUNITY): Payer: Self-pay | Admitting: Emergency Medicine

## 2018-05-04 DIAGNOSIS — Z79899 Other long term (current) drug therapy: Secondary | ICD-10-CM | POA: Insufficient documentation

## 2018-05-04 DIAGNOSIS — G43001 Migraine without aura, not intractable, with status migrainosus: Secondary | ICD-10-CM

## 2018-05-04 DIAGNOSIS — G43909 Migraine, unspecified, not intractable, without status migrainosus: Secondary | ICD-10-CM | POA: Insufficient documentation

## 2018-05-04 LAB — CBC
HCT: 44.3 % (ref 36.0–46.0)
HEMOGLOBIN: 14.3 g/dL (ref 12.0–15.0)
MCH: 27.7 pg (ref 26.0–34.0)
MCHC: 32.3 g/dL (ref 30.0–36.0)
MCV: 85.7 fL (ref 80.0–100.0)
NRBC: 0 % (ref 0.0–0.2)
Platelets: 247 10*3/uL (ref 150–400)
RBC: 5.17 MIL/uL — AB (ref 3.87–5.11)
RDW: 11.9 % (ref 11.5–15.5)
WBC: 14.1 10*3/uL — AB (ref 4.0–10.5)

## 2018-05-04 LAB — BASIC METABOLIC PANEL
ANION GAP: 6 (ref 5–15)
BUN: 15 mg/dL (ref 6–20)
CO2: 25 mmol/L (ref 22–32)
Calcium: 8.9 mg/dL (ref 8.9–10.3)
Chloride: 106 mmol/L (ref 98–111)
Creatinine, Ser: 0.69 mg/dL (ref 0.44–1.00)
GFR calc Af Amer: >60 mL/min (ref >60)
GLUCOSE: 110 mg/dL — AB (ref 70–99)
POTASSIUM: 4.4 mmol/L (ref 3.5–5.1)
Sodium: 137 mmol/L (ref 135–145)

## 2018-05-04 LAB — I-STAT BETA HCG BLOOD, ED (MC, WL, AP ONLY): I-stat hCG, quantitative: <5 m[IU]/mL (ref <5)

## 2018-05-04 MED ORDER — SODIUM CHLORIDE 0.9 % IV BOLUS
500.0000 mL | Freq: Once | INTRAVENOUS | Status: AC
  Administered 2018-05-04: 500 mL via INTRAVENOUS

## 2018-05-04 MED ORDER — DIPHENHYDRAMINE HCL 50 MG/ML IJ SOLN
50.0000 mg | Freq: Once | INTRAMUSCULAR | Status: DC
  Filled 2018-05-04: qty 1

## 2018-05-04 MED ORDER — DEXAMETHASONE SODIUM PHOSPHATE 10 MG/ML IJ SOLN
10.0000 mg | Freq: Once | INTRAMUSCULAR | Status: AC
  Administered 2018-05-04: 10 mg via INTRAVENOUS
  Filled 2018-05-04: qty 1

## 2018-05-04 MED ORDER — METOCLOPRAMIDE HCL 10 MG PO TABS: 10.0000 mg | ORAL_TABLET | Freq: Four times a day (QID) | ORAL | 0 refills | Status: DC | PRN

## 2018-05-04 MED ORDER — METOCLOPRAMIDE HCL 5 MG/ML IJ SOLN
10.0000 mg | Freq: Once | INTRAMUSCULAR | Status: AC
  Administered 2018-05-04: 10 mg via INTRAVENOUS
  Filled 2018-05-04: qty 2

## 2018-05-04 MED ORDER — KETOROLAC TROMETHAMINE 15 MG/ML IJ SOLN
15.0000 mg | Freq: Once | INTRAMUSCULAR | Status: AC
  Administered 2018-05-04: 15 mg via INTRAVENOUS
  Filled 2018-05-04: qty 1

## 2018-05-04 NOTE — Discharge Instructions (Addendum)
Take Tylenol and Motrin as needed for headaches. You can try Reglan and take it with Benadryl for migraine-like headaches.  Be careful to make you sleepy. Return for strokelike symptoms, recurrent vomiting or worsening or new symptoms.

## 2018-05-04 NOTE — ED Triage Notes (Addendum)
Pt has hx of migraines. States she has had a right sided headache since yesterday. States normally her pain is to the entire head. No weakness. She does endorse bilateral blurry vision and sensitivity to light. She was seen by a doctor yesterday who gave her a prescription for percocet with no relief. Pt pain 10/10. Pt states she had labs drawn yesterday on [address] but does not know the name of her doctor who drew them.

## 2018-05-04 NOTE — ED Notes (Signed)
Patient transported to CT 

## 2018-05-04 NOTE — ED Provider Notes (Signed)
[facility] EMERGENCY DEPARTMENT Provider Note   CSN: [id number] Arrival date & time: 05/04/18  1252     History   Chief Complaint Chief Complaint  Patient presents with  . Headache    HPI Heidi Rogers is a 52 y.o. female.  Patient's had gradually worsening and persistent headache for almost 2 weeks.  Worse in the right side for the past day.  Patient has history of migraines monthly however this is more severe than slightly different.  Patient has mild blurry vision and photophobia.  No fevers or chills.  No neck stiffness.  Interpreter used.  No head injury or new medications.  No history of aneurysms.  Located central and frontal.     Past Medical History:  Diagnosis Date  . Migraines     Patient Active Problem List   Diagnosis Date Noted  . Other migraine without status migrainosus, not intractable 06/01/2014  . Elevated blood sugar 06/01/2014  . Pain of left breast 04/14/2014    Past Surgical History:  Procedure Laterality Date  . TUBAL LIGATION       OB History    Gravida  5   Para  5   Term  5   Preterm      AB      Living  5     SAB      TAB      Ectopic      Multiple      Live Births               Home Medications    Prior to Admission medications   Medication Sig Start Date End Date Taking? Authorizing Provider  ibuprofen (ADVIL,MOTRIN) 200 MG tablet Take 400 mg by mouth every 6 (six) hours as needed for headache or moderate pain.    Yes [provider]  metoprolol tartrate (LOPRESSOR) 50 MG tablet Take 50 mg by mouth every 12 (twelve) hours. 05/03/18  Yes [provider]  oxyCODONE-acetaminophen (PERCOCET/ROXICET) 5-325 MG tablet Take 1 tablet by mouth every 6 (six) hours as needed (pain/headache).  05/03/18  Yes [provider]  prochlorperazine (COMPAZINE) 10 MG tablet Take 10 mg by mouth every 6 (six) hours as needed for nausea or vomiting.  05/03/18  Yes [provider]    SUMAtriptan (IMITREX) 100 MG tablet Take 100 mg by mouth every 2 (two) hours as needed for migraine or headache (max 2 doses/24 hours).  05/03/18  Yes [provider]  acetaminophen (TYLENOL 8 HOUR) 650 MG CR tablet Take 1 tablet (650 mg total) by mouth every 8 (eight) hours as needed for pain. Patient not taking: Reported on 05/04/2018 06/08/17   Kellie Shropshire, PA-C  ibuprofen (ADVIL,MOTRIN) 600 MG tablet Take 1 tablet (600 mg total) by mouth every 6 (six) hours as needed. Patient not taking: Reported on 05/04/2018 06/08/17   Kellie Shropshire, PA-C  metoCLOPramide (REGLAN) 10 MG tablet Take 1 tablet (10 mg total) by mouth every 6 (six) hours as needed for nausea or vomiting (headache). 05/04/18   Blane Ohara, MD  SUMAtriptan (IMITREX) 50 MG tablet Take 1 tablet (50 mg total) by mouth every 2 (two) hours as needed for migraine or headache. May repeat in 2 hours if headache persists or recurs. Patient not taking: Reported on 05/04/2018 06/01/14   Doris Cheadle, MD    Family History Family History  Problem Relation Age of Onset  . Migraines Unknown     Social History  Social History   Tobacco Use  . Smoking status: Never Smoker  . Smokeless tobacco: Never Used  Substance Use Topics  . Alcohol use: No  . Drug use: No     Allergies   Patient has no known allergies.   Review of Systems Review of Systems  Constitutional: Negative for chills and fever.  HENT: Negative for congestion.   Eyes: Positive for photophobia and visual disturbance.  Respiratory: Negative for shortness of breath.   Cardiovascular: Negative for chest pain.  Gastrointestinal: Negative for abdominal pain and vomiting.  Genitourinary: Negative for dysuria and flank pain.  Musculoskeletal: Negative for back pain, neck pain and neck stiffness.  Skin: Negative for rash.  Neurological: Positive for headaches. Negative for light-headedness.     Physical Exam Updated Vital Signs BP (!) 118/56    Pulse 67   Temp 97.7 F (36.5 C) (Oral)   Resp 19   LMP 02/01/2018   SpO2 98%   Physical Exam  Constitutional: She is oriented to person, place, and time. She appears well-developed and well-nourished.  HENT:  Head: Normocephalic and atraumatic.  Eyes: Conjunctivae are normal. Right eye exhibits no discharge. Left eye exhibits no discharge.  Neck: Normal range of motion. Neck supple. No tracheal deviation present.  Cardiovascular: Normal rate and regular rhythm.  Pulmonary/Chest: Effort normal and breath sounds normal.  Abdominal: Soft. She exhibits no distension. There is no tenderness. There is no guarding.  Musculoskeletal: She exhibits no edema.  Neurological: She is alert and oriented to person, place, and time.  5+ strength in UE and LE with f/e at major joints. Sensation to palpation intact in UE and LE. CNs 2-12 grossly intact.  EOMFI.  PERRL.   Finger nose and coordination intact bilateral.   Visual fields intact to finger testing. No nystagmus   Skin: Skin is warm. No rash noted.  Psychiatric: She has a normal mood and affect.  Nursing note and vitals reviewed.    ED Treatments / Results  Labs (all labs ordered are listed, but only abnormal results are displayed) Labs Reviewed  BASIC METABOLIC PANEL - Abnormal; Notable for the following components:      Result Value   Glucose, Bld 110 (*)    All other components within normal limits  CBC - Abnormal; Notable for the following components:   WBC 14.1 (*)    RBC 5.17 (*)    All other components within normal limits  CBG MONITORING, ED  I-STAT BETA HCG BLOOD, ED ([facility], [facility], [facility] ONLY)    EKG None  Radiology Ct Head Wo Contrast  Result Date: 05/04/2018 CLINICAL DATA:  Right-sided headache since yesterday. History of migraines. There are different qualities to this headache, including blurry vision. EXAM: CT HEAD WITHOUT CONTRAST TECHNIQUE: Contiguous axial images were obtained from the base of the skull through the  vertex without intravenous contrast. COMPARISON:  Head CT 07/12/2009 FINDINGS: Brain: There is no mass, hemorrhage or extra-axial collection. The size and configuration of the ventricles and extra-axial CSF spaces are normal. The brain parenchyma is normal, without evidence of acute or chronic infarction. There is no colloid cyst. The position of the cerebellar tonsils is normal. Vascular: No abnormal hyperdensity of the major intracranial arteries or dural venous sinuses. No intracranial atherosclerosis. Skull: The visualized skull base, calvarium and extracranial soft tissues are normal. Sinuses/Orbits: No fluid levels or advanced mucosal thickening of the visualized paranasal sinuses. No mastoid or middle ear effusion. The orbits are normal. IMPRESSION: Normal head CT.  Electronically Signed   By: Deatra Robinson M.D.   On: 05/04/2018 17:15    Procedures Procedures (including critical care time)  Medications Ordered in ED Medications  diphenhydrAMINE (BENADRYL) injection 50 mg (50 mg Intravenous Refused 05/04/18 1641)  metoCLOPramide (REGLAN) injection 10 mg (10 mg Intravenous Given 05/04/18 1640)  dexamethasone (DECADRON) injection 10 mg (10 mg Intravenous Given 05/04/18 1639)  sodium chloride 0.9 % bolus 500 mL (0 mLs Intravenous Stopped 05/04/18 1757)  ketorolac (TORADOL) 15 MG/ML injection 15 mg (15 mg Intravenous Given 05/04/18 1818)     Initial Impression / Assessment and Plan / ED Course  I have reviewed the triage vital signs and the nursing notes.  Pertinent labs & imaging results that were available during my care of the patient were reviewed by me and considered in my medical decision making (see chart for details).    Spanish-speaking female presents with headache worse than her typical.  No focal neuro deficits.  Patient has had for 2 weeks.  CT scan no acute findings.  Blood work reviewed no acute findings.  Patient given migraine cocktail improved symptomatically.  Patient will need  close follow-up with primary doctor and possible neurology for better headache management.  On reassessment patient improved significantly.  Pain minimal.  CT scan reviewed no acute findings.  Discussed follow-up with primary doctor and neurology.  Final Clinical Impressions(s) / ED Diagnoses   Final diagnoses:  Migraine without aura and with status migrainosus, not intractable    ED Discharge Orders         Ordered    metoCLOPramide (REGLAN) 10 MG tablet  Every 6 hours PRN     05/04/18 1912           Blane Ohara, MD 05/04/18 1913

## 2018-07-30 ENCOUNTER — Encounter: Payer: Self-pay | Admitting: Pediatric Intensive Care

## 2018-07-31 NOTE — Congregational Nurse Program (Signed)
  Dept: [phone]   Congregational Nurse Program Note  Date of Encounter: 07/30/2018  Past Medical History: Past Medical History:  Diagnosis Date  . Migraines     Encounter Details: Via interpreter Dorita- client  states history of severe headaches. She states that she went to the ED a couple of months ago due to a headache. She was given "medicine for high blood pressure" which has improved the severity and frequency of the headaches. Client complained of right eye "pain and tearing" unrelated to headaches. Duration 2 months. She states history of mouth "pulling" at right corner about 2 years ago. She currently does not have a PCP. [employer] account executive reviewed. Client would like to return to [facility]. She has at least 2 refills of metoprolol. CN will refer client to [facility]. Will contact client with appointment date. Client will return to clinic as needed. Shann Medal RN BSN CNP [phone]

## 2018-08-01 ENCOUNTER — Telehealth: Payer: Self-pay

## 2018-08-01 NOTE — Telephone Encounter (Signed)
Message received from [facility] requesting an appointment for the patient at [facility]. Informed her that an appointment has been scheduled for 09/03/2018 @ 1350.

## 2018-09-03 ENCOUNTER — Ambulatory Visit: Payer: Self-pay | Admitting: Family Medicine

## 2018-12-19 ENCOUNTER — Telehealth: Payer: Self-pay | Admitting: Pediatric Intensive Care

## 2018-12-19 NOTE — Telephone Encounter (Signed)
Via interpreter Philis Nettle, call to client. 10 months ago had issues with right eye pain. Visited eye doctors, got prescription for eye drops. She was given a donation by a local church to use to pick up the medication at [facility] but the pharmacy said she couldn't use the donation coupon. Client also states that she was unable to apply for [employer] due to m

## 2019-08-06 ENCOUNTER — Other Ambulatory Visit (HOSPITAL_COMMUNITY): Payer: Self-pay

## 2019-08-06 DIAGNOSIS — Z1231 Encounter for screening mammogram for malignant neoplasm of breast: Secondary | ICD-10-CM

## 2019-09-23 ENCOUNTER — Encounter: Payer: Self-pay | Admitting: Student

## 2019-09-23 ENCOUNTER — Other Ambulatory Visit: Payer: Self-pay

## 2019-09-23 ENCOUNTER — Ambulatory Visit: Payer: Self-pay | Admitting: Student

## 2019-09-23 ENCOUNTER — Ambulatory Visit
Admission: RE | Admit: 2019-09-23 | Discharge: 2019-09-23 | Disposition: A | Discharge status: Home / Self Care | Payer: No Typology Code available for payment source | Source: Ambulatory Visit | Attending: Obstetrics and Gynecology | Admitting: Obstetrics and Gynecology

## 2019-09-23 VITALS — BP 140/84 | Temp 97.8°F | Wt 142.0 lb

## 2019-09-23 DIAGNOSIS — Z124 Encounter for screening for malignant neoplasm of cervix: Secondary | ICD-10-CM

## 2019-09-23 DIAGNOSIS — Z1231 Encounter for screening mammogram for malignant neoplasm of breast: Secondary | ICD-10-CM

## 2019-09-23 NOTE — Progress Notes (Signed)
[name] is a 54 y.o. [id number] female who presents to [facility] clinic today with no complaints.    Pap Smear: Pap smear completed today. Last Pap smear was 2015 at unknown clinic and was normal. Per patient has no history of an abnormal Pap smear. Last Pap smear result is not available in Epic.   Physical exam: Breasts Breasts symmetrical. No skin abnormalities bilateral breasts. Bilateral inverted nipples; pt states this is no change. No nipple discharge bilateral breasts. No lymphadenopathy. No lumps palpated bilateral breasts.       Pelvic/Bimanual Ext Genitalia No lesions, no swelling and no discharge observed on external genitalia.        Vagina Vagina pink and normal texture. No lesions or discharge observed in vagina.        Cervix Cervix is present. Cervix pink and of normal texture. No discharge observed.    Uterus Uterus is present and palpable. Uterus in normal position and normal size.        Adnexae Bilateral ovaries present and palpable. No tenderness on palpation.         Rectovaginal No rectal exam completed today since patient had no rectal complaints. No skin abnormalities observed on exam.     Smoking History: Patient has never smoked. Not referred to quit line.    Patient Navigation: Patient education provided. Access to services provided for patient through [facility] program. Spanish interpreter provided. No transportation provided   Colorectal Cancer Screening: Per patient has never had colonoscopy completed No complaints today.    Breast and Cervical Cancer Risk Assessment: Patient does not have family history of breast cancer, known genetic mutations, or radiation treatment to the chest before age 35. Patient does not have history of cervical dysplasia, immunocompromised, or DES exposure in-utero.  Risk Assessment    Risk Scores      09/23/2019   Last edited by: Narda Rutherford, LPN   5-year risk: 0.5 %   Lifetime risk: 4 %          A: BCCCP  exam with pap smear   P: Referred patient to [facility] for a screening mammogram. Appointment scheduled later today.  Judeth Horn, NP 09/23/2019 10:33 AM

## 2019-09-23 NOTE — Addendum Note (Signed)
Addended by: Narda Rutherford on: 09/23/2019 11:41 AM   Modules accepted: Orders

## 2019-09-24 LAB — CYTOLOGY - PAP
Comment: NEGATIVE
Diagnosis: NEGATIVE
High risk HPV: NEGATIVE

## 2019-10-01 ENCOUNTER — Other Ambulatory Visit: Payer: Self-pay

## 2019-10-01 ENCOUNTER — Other Ambulatory Visit: Payer: Self-pay | Admitting: Obstetrics and Gynecology

## 2019-10-01 ENCOUNTER — Inpatient Hospital Stay: Payer: Self-pay | Attending: Obstetrics and Gynecology | Admitting: *Deleted

## 2019-10-01 VITALS — BP 122/80 | Temp 97.3°F | Ht 60.25 in | Wt 143.0 lb

## 2019-10-01 DIAGNOSIS — Z0000 Encounter for general adult medical examination without abnormal findings: Secondary | ICD-10-CM

## 2019-10-01 NOTE — Progress Notes (Signed)
[facility]isewoman initial screening   interpre[place], [place] #[id number] ([place])   Clinical Measurement:  Height: 60.25 in Weight: 143 lb  Blood Pressure: 128/82  Blood Pressure #2: 122/80 Fasting Labs Drawn Today, will review with patient wen they result.   Medical History:  Patient states that she does not have a history of high cholesterol, high blood pressure or diabetes.  Medications: Patient states that she does not take medication to lower cholesterol, blood pressure or blood sugar. Patient does not take an aspirin a day to help prevent a heart attack or stroke.    Blood pressure, self measurement: Patient states that she does measure blood pressure from home and has not been told to do so by a healthcare provider.   Nutrition: Patient states that on average she eats 2 cups of fruit and 2 cups of vegetables per day. Patient states that she does not eat fish at least 2 times per week. Patient eats less than half servings of whole grains. Patient drinks less than 36 ounces of beverages with added sugar weekly. Patient is currently watching sodium or salt intake. In the past 7 days patient has not had any drinks containing alcohol. On average patient does not drink any drinks containing alcohol.      Physical activity:  Patient states that she gets 60 minutes of moderate and 0 minutes of vigorous physical activity each week.  Smoking status:  Patient states that she has never smoked tobacco.   Quality of life:  Over the past 2 weeks patient states that she has not had any days where she has little interest or pleasure in doing things and several days where she has felt down, depressed or hopeless.    Risk reduction and counseling:    Health Coaching: Spoke with patient about the daily recommendation for fruits and vegetables. Showed patient what one serving would look like. Encouraged patient to try and add an extra serving of vegetables daily. Encouraged patient to also try and add more whole grains  in daily diet. Gave examples of whole wheat bread, brown rice, whole grain pasta, whole grain cereals or oatmeal. Encouraged patient to try and get 20 minutes of exercise daily.    Navigation:  I will notify patient of lab results. Patient is aware of 2 more health coaching sessions and a follow up.  Time: 20 minutes

## 2019-10-02 LAB — LIPID PANEL W/O CHOL/HDL RATIO
Cholesterol, Total: 213 mg/dL — ABNORMAL HIGH (ref 100–199)
HDL: 42 mg/dL (ref >39)
LDL Chol Calc (NIH): 153 mg/dL — ABNORMAL HIGH (ref 0–99)
Triglycerides: 97 mg/dL (ref 0–149)
VLDL Cholesterol Cal: 18 mg/dL (ref 5–40)

## 2019-10-02 LAB — GLUCOSE, RANDOM: Glucose: 105 mg/dL — ABNORMAL HIGH (ref 65–99)

## 2019-10-02 LAB — HGB A1C W/O EAG: Hgb A1c MFr Bld: 5.5 % (ref 4.8–5.6)

## 2019-10-07 NOTE — Progress Notes (Signed)
Will do!

## 2019-10-08 ENCOUNTER — Telehealth: Payer: Self-pay

## 2019-10-09 NOTE — Telephone Encounter (Signed)
Health coaching 2   interpreter- [name],    Labs- 213 cholesterol ,153 LDL cholesterol, 97 triglycerides , 42 HDL cholesterol, 5.5 hemoglobin A1C, 105 mean plasma glucose Patient understands and is aware of her lab results.   Goals-  Discussed lab results with patient and answered any questions that patient had regarding results.   Decrease the amount of fried and fatty foods that she consumes. Try baking, broiling, sauteeing or grilling food instead. Reduce the amount of red meat and whole fat dairy products consumed. Add in more whole grains and heart healthy fish into diet. Add more heart healthy fats in diet like avocados, olive oil and nuts. Be mindful of the amount of sweets and sugars consumed as well as carbohydrates since glucose was slightly elevated.    Navigation:  Patient is aware of 1 more health coaching sessions and a follow up. Will call patient with follow-up appointment information for Internal Medicine for elevated labs once appointment is scheduled.   Time- 17 minutes

## 2019-10-13 NOTE — Progress Notes (Signed)
Patient seen and assessed by nursing staff during this encounter. I have reviewed the chart and agree with the documentation and plan. I have also made any necessary editorial changes.  Catalina Antigua, MD 10/13/2019 11:00 AM

## 2019-10-22 ENCOUNTER — Ambulatory Visit (INDEPENDENT_AMBULATORY_CARE_PROVIDER_SITE_OTHER): Payer: Self-pay | Admitting: Internal Medicine

## 2019-10-22 ENCOUNTER — Other Ambulatory Visit: Payer: Self-pay

## 2019-10-22 VITALS — BP 124/72 | HR 70 | Temp 98.4°F | Ht 59.0 in | Wt 145.4 lb

## 2019-10-22 DIAGNOSIS — E7800 Pure hypercholesterolemia, unspecified: Secondary | ICD-10-CM | POA: Insufficient documentation

## 2019-10-22 DIAGNOSIS — G43809 Other migraine, not intractable, without status migrainosus: Secondary | ICD-10-CM

## 2019-10-22 NOTE — Progress Notes (Signed)
   CC: high cholesterol and establish care  HPI:  [name] is a 54 y.o. F with significant PMH migraine headaches presents for elevated lipid panel and to establish care. Please see problem-based charting for additional information.   Due to language barrier, an interpreter, Sue Lush [phone], was present during the history-taking and subsequent discussion (and for part of the physical exam) with this patient.  Past Medical History:  Diagnosis Date  . Hypertension   . Migraines    Social History   Tobacco Use  . Smoking status: Never Smoker  . Smokeless tobacco: Never Used  Substance Use Topics  . Alcohol use: No  . Drug use: Not Currently   Family History No family history of diabetes, high blood pressure, high cholesterol, chronic lung disease, ESRD, or cancers.  Review of Systems:   Review of Systems  Constitutional: Negative for chills, fever and malaise/fatigue.  HENT: Negative for congestion and sore throat.   Respiratory: Negative for cough and shortness of breath.   Cardiovascular: Negative for chest pain and palpitations.  Gastrointestinal: Negative for abdominal pain, nausea and vomiting.  Musculoskeletal: Negative for joint pain and myalgias.  Neurological: Positive for headaches (last one 1 year ago). Negative for dizziness and weakness.   Physical Exam:  Vitals:   10/22/19 1450  BP: 124/72  Pulse: 70  Temp: 98.4 F (36.9 C)  TempSrc: Oral  SpO2: 100%  Weight: 145 lb 6.4 oz (66 kg)  Height: 4\' 11"  (1.499 m)   Physical Exam Vitals and nursing note reviewed.  Constitutional:      General: She is not in acute distress.    Appearance: She is not ill-appearing.     Comments: Pt accompanied by daughter.  Cardiovascular:     Rate and Rhythm: Normal rate and regular rhythm.     Heart sounds: Normal heart sounds.  Pulmonary:     Effort: Pulmonary effort is normal.     Breath sounds: Normal breath sounds. No wheezing, rhonchi or rales.  Abdominal:       General: Abdomen is flat. Bowel sounds are normal. There is no distension.     Palpations: Abdomen is soft.     Tenderness: There is no abdominal tenderness.  Musculoskeletal:     Right lower leg: No edema.     Left lower leg: No edema.  Skin:    General: Skin is warm and dry.  Neurological:     Mental Status: She is alert.  Psychiatric:        Mood and Affect: Mood normal.    Assessment & Plan:   See Encounters Tab for problem based charting.  Patient discussed with Dr. 

## 2019-10-22 NOTE — Patient Instructions (Addendum)
Heidi Rogers un placer conocerte hoy! Contine trabajando para caminar o hacer ms ejercicio adems de comer sano con alimentos bajos en grasa. A continuacin se adjuntan las instrucciones para comenzar con un plan de alimentacin saludable para el corazn.  Realice un seguimiento en nuestra clnica en 1 ao para la atencin mdica de rutina o antes si lo necesita.  Gracias por permitirnos ser parte de su cuidado!   Plan de alimentacin cardiosaludable Heart-Healthy Eating Plan La planificacin de las comidas cardiosaludables incluye lo siguiente:  Consumir menos grasas no saludables.  Consumir ms grasas saludables.  Realizar otros cambios en su dieta. Hable con el mdico o con un especialista en alimentacin (nutricionista) para crear un plan de alimentacin que sea adecuado para usted. Cules son algunos consejos para seguir este plan? Al cocinar Evite frer los alimentos. En cambio, trate de cocinarlos en el horno, en la plancha o en la parrilla, o hervirlos. Tambin puede reducir las grasas de la siguiente forma:  Quite la piel de las aves.  Quite todas las grasas visibles de las carnes.  Cocine al vapor las verduras en agua o caldo. Planificacin de las comidas   En las comidas, [place] su plato en cuatro partes iguales: ? Llene la mitad del plato con verduras y ensaladas de hojas verdes. ? Llene un cuarto del plato con cereales integrales. ? Llene un cuarto del plato con alimentos con protenas magras.  Coma 4 o 5porciones de verduras Market researcher. Una porcin de verduras equivale a lo siguiente: ? 1taza de verduras crudas o cocidas. ? 2 tazas de verduras de hojas verdes crudas.  Coma 4 o 5porciones de frutas por da. Una porcin de fruta equivale a lo siguiente: ? 1 fruta mediana entera. ? taza de fruta desecada. ?  taza de fruta fresca, congelada o enlatada. ? taza de jugo 100% de fruta.  Consuma ms alimentos que tengan fibra soluble. Ellos son las  [place], el brcoli, las [place], los frijoles, los guisantes y Nature conservation officer. Trate de consumir de 20a 30g de [employer] Stores.  Coma 4 o 5porciones de frutos secos, legumbres y semillas por semana: ? 1 porcin de frijoles o legumbres secos equivale a taza despus de su coccin. ? 1 porcin de frutos secos equivale a  de taza. ? 1 porcin de semillas equivale a una cucharada. Informacin general  Coma ms comidas caseras. Coma menos comida de restaurantes, bufs y comida rpida.  Limite o evite el alcohol.  Limite los alimentos con alto contenido de almidn y Location manager.  Evite las comidas fritas.  Baje de peso si es necesario.  Intente llevar un registro de la cantidad de sal (sodio) que ingiere. Esto es importante si tiene presin arterial alta. Pdale a su mdico que le d ms informacin al respecto.  Trate de incorporar comidas vegetarianas cada semana. [place] grasas saludables. Estas incluyen aceite de oliva y de canola, semillas de lino, nueces, almendras y semillas.  Consuma ms grasas omega-3. Estas incluyen salmn, caballa, sardinas, atn, aceite de lino y semillas de lino molidas. Intente comer pescado al [facility] veces por semana.  Lea las etiquetas de los alimentos. Evite los alimentos que contienen grasas trans o altas cantidades de grasas saturadas.  Limite el consumo de grasas saturadas. ? Estas se encuentran frecuentemente en productos de origen animal, como carnes, mantequilla y crema. ? Tambin se encuentran en alimentos de origen vegetal, como el aceite de palma, el aceite de palmiste y el aceite de  coco.  Evite los alimentos con aceites parcialmente hidrogenados. Estos tienen grasas trans. [address] [place], se incluyen margarinas en barra, algunas margarinas untables, galletas dulces y [place] y otros productos horneados. Qu alimentos puedo comer? Nils Pyle Nils Pyle frescas, en conserva (en su jugo natural) o frutas congeladas. Verduras Verduras  frescas o congeladas (crudas, al vapor, asadas o grilladas). Ensaladas de hojas verdes. Cereales La mayora de los cereales. Elija casi siempre trigo integral y Radiation protection practitioner. Arroz y pastas, incluido el arroz integral y las pastas elaboradas con trigo integral. Armed forces operational officer y otras protenas [place] magras de res, ternera, cerdo y cordero a las que se les haya quitado la grasa visible. Pollo y pavo sin piel. Todos los pescados y [employer]. Pato salvaje, conejo, faisn y venado. Claras de huevo o sustitutos del huevo bajos en colesterol. Porotos, guisantes y lentejas secos y tofu. Semillas y la mayora de los frutos secos. Lcteos Quesos descremados y semidescremados, entre ellos, ricota y Garment/textile technologist. Leche descremada o al 1% que sea lquida, en polvo o evaporada. Suero de [employer] elaborado con [employer]. Yogur descremado o semidescremado. Grasas y Writer no hidrogenadas (sin grasas trans). Aceites vegetales, incluido el de soja, ssamo, girasol, [place], man, crtamo, maz, canola y semillas de algodn. Alios para ensalada o mayonesa elaborados con aceite vegetal. Bebidas Agua mineral. T y caf. Gaseosas dietticas. Dulces y [employer], gelatina y helado de frutas. Pequeas cantidades de chocolate amargo. Limite todos los dulces y postres. Alios y [employer] Mills y condimentos. Es posible que los productos que se enumeran ms [place] no constituyan una lista completa de los alimentos y las bebidas que puede tomar. Consulte a un nutricionista para conocer ms opciones. Qu alimentos debo evitar? Nils Pyle Fruta enlatada en almbar espeso. Frutas con salsa de crema o mantequilla. Frutas cocidas en aceite. Limite el consumo de coco. Verduras Verduras cocinadas con salsas de queso, crema o mantequilla. Verduras fritas. Cereales [place] elaborados con grasas saturadas o trans, aceites o Eastman Kodak. Croissants. Panecillos dulces. Rosquillas. Galletas con alto  contenido de grasas, como las que contienen [place]. Carnes y [address] protenas [address] grasas, como perros calientes, [place] de res, salchichas, tocino, asado de Producer, television/film/video o [place]. Fiambres con alto contenido de [place], como salame y Loss adjuster, chartered. Caviar. Pato y ganso domsticos. Vsceras, como hgado. Lcteos Crema, crema agria, queso crema y [place] cottage con crema. Quesos enteros. Leche entera o al 2% que sea [place], evaporada o condensada. Suero de [employer]. Salsa de crema o queso con alto contenido de [place]. Yogur elaborado con Eastman Kodak. Grasas y [place] de carne o materia grasa. Manteca de cacao, aceites hidrogenados, aceite de palma, aceite de coco, aceite de palmiste. Grasas y [address] grasas slidas, incluida la grasa del tocino, el cerdo [place], la [place] de cerdo y Civil engineer, contracting. Sustitutos no lcteos de la crema. Aderezos para ensalada con queso o crema agria. Bebidas Refrescos regulares y jugos con agregado de International aid/development worker. Dulces y [employer]. Pudin. Galletas dulces. Bizcochuelos. Pasteles. Chocolate con leche o chocolate blanco. Almbares con mantequilla. Helados o bebidas elaboradas con helado con alto contenido de grasas. Esta no es Raytheon de los alimentos y las bebidas que se Theatre stage manager. Consulte a un nutricionista para obtener ms informacin. Resumen  La planificacin de las comidas cardiosaludables incluye comer menos grasas poco saludables, comer ms grasas saludables y hacer otros cambios en su dieta.  Siga una dieta equilibrada. Esta incluye frutas y verduras, productos lcteos descremados o semidescremados, protenas magras, frutos secos  y legumbres, cereales integrales y aceites y grasas cardiosaludables. Esta informacin no tiene Theme park manager el consejo del mdico. Asegrese de hacerle al mdico cualquier pregunta que tenga. Document Revised: 09/29/2017 Document Reviewed: 09/29/2017 Elsevier Patient Education  2020 [employer].

## 2019-11-06 NOTE — Assessment & Plan Note (Signed)
Pt with history of migraine headaches. She says her last one was a year ago when she was seen in the ED. Describes them as right sided with associated photophobia. Occasionally associated with blurry vision or eye watering. Denies aura symptoms, and believes they are triggered by stress. Per chart review, pt previously prescribed sumatriptan but pt states she has none at home. Has never seen a neurologist for her headaches. Pt denies headaches, vision changes, dizziness, or weakness today. No gross focal deficits on examination. Discussed with pt that we will continue to monitor her headache frequency and characteristics. Symptoms certainly concerning for migraine vs cluster headaches.

## 2019-11-06 NOTE — Assessment & Plan Note (Signed)
Pt presents to establish care. Was found on [facility] Woman screening to have hyperlipidemia. Total cholesterol 213, triglycerides 97, HDL 42, LDL 153. BP today 124/72. A1c 5.5. ASCVD risk score of 2.0%. No indication for statin therapy at this time. Discussed lifestyle modifications with increased exercise and weight loss. Heart heathy eating plan provided on AVS. Pt understanding, and has already begun walking more since her lab results 3 weeks ago.

## 2019-11-07 NOTE — Progress Notes (Signed)
Internal Medicine Clinic Attending  Case discussed with Dr. Jones at the time of the visit.  We reviewed the resident's history and exam and pertinent patient test results.  I agree with the assessment, diagnosis, and plan of care documented in the resident's note.  

## 2019-11-14 ENCOUNTER — Telehealth: Payer: Self-pay

## 2019-11-14 NOTE — Telephone Encounter (Signed)
Via, Heidi Rogers, Spanish Interpreter, Patient informed negative Pap/HPV results, next pap due within 5 years. Patient verbalized understanding.

## 2019-12-25 ENCOUNTER — Encounter: Payer: No Typology Code available for payment source | Admitting: Internal Medicine

## 2020-01-27 ENCOUNTER — Telehealth: Payer: Self-pay | Admitting: Pediatric Intensive Care

## 2020-01-27 NOTE — Telephone Encounter (Signed)
Left HIPAA compliant message for client to return call to CN at [phone]. Client had contacted [facility] case worker on 7/22 regarding return of eye symptoms after having another migraine. CN will attempt to contact tomorrow morning. Shann Medal RN BSN CNP [phone]

## 2020-01-28 ENCOUNTER — Telehealth: Payer: Self-pay | Admitting: Pediatric Intensive Care

## 2020-01-28 NOTE — Telephone Encounter (Signed)
Call to client to follow up on referral from case manager at [facility].  Client states that her right eye hurts every day and that it hurts under her eye. She has been getting headaches frequently. When she has headaches she has "blurry vision". Client said that she saw a doctor at [facility] in April. She went due to migraine that she "thought I was going to die". She states she has taken Imitrex in the past- she states that

## 2020-02-02 ENCOUNTER — Telehealth: Payer: Self-pay | Admitting: *Deleted

## 2020-02-02 NOTE — Telephone Encounter (Signed)
Spoke w/ victoriaH. Congregational nurse, she states that pt is continuing to have migraines and eye pain, appt wed am blue team

## 2020-02-04 ENCOUNTER — Encounter: Payer: Self-pay | Admitting: Internal Medicine

## 2020-02-04 ENCOUNTER — Ambulatory Visit (INDEPENDENT_AMBULATORY_CARE_PROVIDER_SITE_OTHER): Payer: Self-pay | Admitting: Internal Medicine

## 2020-02-04 ENCOUNTER — Other Ambulatory Visit: Payer: Self-pay

## 2020-02-04 VITALS — BP 136/69 | HR 65 | Temp 98.1°F | Ht 59.0 in | Wt 146.8 lb

## 2020-02-04 DIAGNOSIS — G43809 Other migraine, not intractable, without status migrainosus: Secondary | ICD-10-CM

## 2020-02-04 DIAGNOSIS — H538 Other visual disturbances: Secondary | ICD-10-CM

## 2020-02-04 MED ORDER — PROCHLORPERAZINE MALEATE 10 MG PO TABS: 10.0000 mg | ORAL_TABLET | Freq: Four times a day (QID) | ORAL | 0 refills | Status: DC | PRN

## 2020-02-04 MED ORDER — KETOROLAC TROMETHAMINE 30 MG/ML IJ SOLN
30.0000 mg | Freq: Once | INTRAMUSCULAR | Status: AC
  Administered 2020-02-04: 30 mg via INTRAMUSCULAR

## 2020-02-04 NOTE — Progress Notes (Signed)
   CC: Rt eye pain and migraine headache  HPI:  [name] is a 54 y.o. female with PMHx as documented below, presented with migraine forehead and rt eye pain and rt and also complaining of chronic rt side blurred vision. Interpreter assisted with taking Hx. Please refer to problem based charting for further details and assessment and plan of current problem and chronic medical conditions.   Past Medical History:  Diagnosis Date  . Hypertension   . Migraines    Review of Systems:   Review of Systems  Constitutional: Negative for chills and fever.  Eyes: Positive for blurred vision and pain. Negative for discharge and redness.  Respiratory: Negative for cough.   Cardiovascular: Negative for chest pain.  Gastrointestinal: Negative for nausea and vomiting.  Neurological: Positive for headaches. Negative for dizziness, sensory change and focal weakness.    Physical Exam:  Vitals:   02/04/20 1018  BP: 136/69  Pulse: 65  Temp: 98.1 F (36.7 C)  TempSrc: Oral  SpO2: 99%  Weight: 146 lb 12.8 oz (66.6 kg)  Height: 4\' 11"  (1.499 m)   Physical Exam: Constitutional: Well-developed and well-nourished. No acute distress.  Head: Normocephalic and atraumatic. EOM intact Eyes: Conjunctivae are normal, EOM intact Cardiovascular: RRR, nl S1S2, no murmur,  no LEE Respiratory: Effort normal and breath sounds normal. No respiratory distress. No wheezes.  GI: Soft. Bowel sounds are normal. No distension. There is no tenderness.  Neurological: Is alert and oriented x 3, sensation is intact. No motor deficit, CNI-XII are intact.  Skin: Not diaphoretic. No erythema.  Psychiatric:  Normal mood and affect. Behavior is normal. Judgment and thought content normal.    Assessment & Plan:   See Encounters Tab for problem based charting.  Patient discussed with Dr. 

## 2020-02-04 NOTE — Patient Instructions (Signed)
  Gracias por permitirnos brindarle atencin hoy. Hoy hablamos sobre su dolor de ojos y [place] y visin borrosa. Puede deberse a migraa y le administramos una inyeccin de Toradol que puede ayudar con el dolor y Nurse, learning disability algn medicamento que pueda tomar segn sea necesario para las nuseas. Para su visin borrosa, no encontramos ninguna anormalidad en nuestro examen rpido de la vista, pero lo remito a un oftalmlogo para una evaluacin adicional. Alguien de su oficina te llamar.  Regrese a la clnica segn sea necesario si sus sntomas empeoran o no mejoran en una semana. Como siempre, si tiene sntomas graves, busque atencin mdica en la sala de Sports administrator.  Asegrese de traer el documento aqu para solicitar la tarjeta naranja. Si tiene alguna pregunta o inquietud, llame a la clnica de medicina interna al [phone].  Gracias!

## 2020-02-05 ENCOUNTER — Encounter: Payer: Self-pay | Admitting: Internal Medicine

## 2020-02-05 DIAGNOSIS — H538 Other visual disturbances: Secondary | ICD-10-CM | POA: Insufficient documentation

## 2020-02-05 NOTE — Assessment & Plan Note (Addendum)
She reports chronic(ocassional?) blurriness of rt vision and photophobia mostly in the morning. No pain with EOM. Explained to her that her symptoms can be due to Migraine but she mentions that it has been going on last couple of months. No acute finding on exam today. I refer her to ophthalmologist for further assessment. She is applying for [facility] card and deos not have any coverage now. She prefers to go ahead and get the referral now though.  -Ambulatory referral to ophthalmology

## 2020-02-05 NOTE — Assessment & Plan Note (Signed)
Patient presented with rt eye and forehead pain and also complains of rt eye blurred vision and photophobia. She had similar presentation to ED and responded to Migraine attack Tx  per chart review but she feels her symptoms are differnet. She mentions that her blurry vision is chronic and mostly in the morning. When I ask her if her Darrel Hoover is blurry now, she mentions that she can see well now.  On exam: EOM intact. No neurologic deficit on exam.  No obvious vision deficit now. Not suggestive of optic neuritis.  Her symptoms can be Migraine attack but per her request and as she reports chronic occasional vision change, refer her to ophthalmologist for further exam and evaluation. (patient is aware that she should likely pay out of pocket for ophthalmology visit. She is in process of applying for orange card).  -Ketorolac 30 mg IM -PO Compazine 10 mg q 6h PRN. (received paper script) -Ambulatory referral to ophthalmologist

## 2020-02-09 NOTE — Progress Notes (Signed)
Internal Medicine Clinic Attending  Case discussed with Dr. Masoudi  At the time of the visit.  We reviewed the resident's history and exam and pertinent patient test results.  I agree with the assessment, diagnosis, and plan of care documented in the resident's note.  

## 2020-02-25 ENCOUNTER — Other Ambulatory Visit: Payer: Self-pay

## 2020-02-25 ENCOUNTER — Ambulatory Visit: Payer: No Typology Code available for payment source

## 2020-05-05 ENCOUNTER — Telehealth: Payer: Self-pay | Admitting: Pediatric Intensive Care

## 2020-05-05 NOTE — Telephone Encounter (Signed)
Call to client with [facility] Interpretation [phone]. Client [id number]. Client had ambulatory eye referral but wanted to go to [facility] for eye care. Client states that they found nothing wrong but she continues to have pain/problems with her eyes. Client states that she received letter for [facility] approval but has not received her card. CN advised client that she will contact [facility] regarding the card. Client would like eye referral re-submitted via [facility]. She tried calling clinic but was placed on hold. CN advised client to call clinic to schedule appointment. CN wll text information regarding [employer]. Shann Medal RN BSN CNP

## 2020-05-05 NOTE — Telephone Encounter (Signed)
Spoke with the patient.  Appt sch for 05/12/2020 @ 3:15 pm with her PCP Dr. Maryla Morrow.

## 2020-05-12 ENCOUNTER — Ambulatory Visit (INDEPENDENT_AMBULATORY_CARE_PROVIDER_SITE_OTHER): Payer: Self-pay | Admitting: Internal Medicine

## 2020-05-12 ENCOUNTER — Other Ambulatory Visit: Payer: Self-pay

## 2020-05-12 ENCOUNTER — Encounter: Payer: Self-pay | Admitting: Internal Medicine

## 2020-05-12 VITALS — BP 120/66 | HR 70 | Temp 98.2°F | Ht 61.0 in | Wt 150.9 lb

## 2020-05-12 DIAGNOSIS — H538 Other visual disturbances: Secondary | ICD-10-CM

## 2020-05-12 DIAGNOSIS — H539 Unspecified visual disturbance: Secondary | ICD-10-CM

## 2020-05-12 DIAGNOSIS — G43809 Other migraine, not intractable, without status migrainosus: Secondary | ICD-10-CM

## 2020-05-12 NOTE — Progress Notes (Signed)
Established Patient Office Visit  Subjective:  Patient ID: Heidi Rogers, female    DOB: 1965-08-25  Age: 54 y.o. MRN: [id number]  CC:  Chief Complaint  Patient presents with  . Referral    pt states she would like a referral for the eye doctor due to when her head start to hurt her eye also hurts   Follow-up of vision issue  HPI Heidi Rogers 54 year old female with past medical history as listed below, presented for follow-up of vision issue. Translator assists with interpretation. Please refer to problem based charting for further details and assessment and plan.  Past Medical History:  Diagnosis Date  . Hypertension   . Migraines      Past Surgical History:  Procedure Laterality Date  . TUBAL LIGATION      No family history on file.  Social History   Socioeconomic History  . Marital status: Single    Spouse name: Not on file  . Number of children: 5  . Years of education: Not on file  . Highest education level: 2nd grade  Occupational History  . Not on file  Tobacco Use  . Smoking status: Never Smoker  . Smokeless tobacco: Never Used  Vaping Use  . Vaping Use: Never used  Substance and Sexual Activity  . Alcohol use: No  . Drug use: Not Currently  . Sexual activity: Yes    Birth control/protection: Surgical  Other Topics Concern  . Not on file  Social History Narrative  . Not on file   Social Determinants of Health   Financial Resource Strain:   . Difficulty of Paying Living Expenses: Not on file  Food Insecurity:   . Worried About Programme researcher, broadcasting/film/video in the Last Year: Not on file  . Ran Out of Food in the Last Year: Not on file  Transportation Needs: No Transportation Needs  . Lack of Transportation (Medical): No  . Lack of Transportation (Non-Medical): No  Physical Activity:   . Days of Exercise per Week: Not on file  . Minutes of Exercise per Session: Not on file  Stress:   . Feeling of Stress : Not on file  Social Connections:   .  Frequency of Communication with Friends and Family: Not on file  . Frequency of Social Gatherings with Friends and Family: Not on file  . Attends Religious Services: Not on file  . Active Member of Clubs or Organizations: Not on file  . Attends Banker Meetings: Not on file  . Marital Status: Not on file  Intimate Partner Violence:   . Fear of Current or Ex-Partner: Not on file  . Emotionally Abused: Not on file  . Physically Abused: Not on file  . Sexually Abused: Not on file    Outpatient Medications Prior to Visit  Medication Sig Dispense Refill  . acetaminophen (TYLENOL 8 HOUR) 650 MG CR tablet Take 1 tablet (650 mg total) by mouth every 8 (eight) hours as needed for pain. (Patient not taking: Reported on 05/04/2018) 30 tablet 0  . ibuprofen (ADVIL,MOTRIN) 200 MG tablet Take 400 mg by mouth every 6 (six) hours as needed for headache or moderate pain.     Marland Kitchen ibuprofen (ADVIL,MOTRIN) 600 MG tablet Take 1 tablet (600 mg total) by mouth every 6 (six) hours as needed. (Patient not taking: Reported on 05/04/2018) 30 tablet 0  . metoCLOPramide (REGLAN) 10 MG tablet Take 1 tablet (10 mg total) by mouth every 6 (six) hours as  needed for nausea or vomiting (headache). (Patient not taking: Reported on 10/01/2019) 6 tablet 0  . metoprolol tartrate (LOPRESSOR) 50 MG tablet Take 50 mg by mouth every 12 (twelve) hours.  5  . oxyCODONE-acetaminophen (PERCOCET/ROXICET) 5-325 MG tablet Take 1 tablet by mouth every 6 (six) hours as needed (pain/headache).   0  . prochlorperazine (COMPAZINE) 10 MG tablet Take 1 tablet (10 mg total) by mouth every 6 (six) hours as needed for nausea or vomiting. 20 tablet 0  . SUMAtriptan (IMITREX) 100 MG tablet Take 100 mg by mouth every 2 (two) hours as needed for migraine or headache (max 2 doses/24 hours).   4  . SUMAtriptan (IMITREX) 50 MG tablet Take 1 tablet (50 mg total) by mouth every 2 (two) hours as needed for migraine or headache. May repeat in 2 hours if  headache persists or recurs. (Patient not taking: Reported on 05/04/2018) 10 tablet 0   No facility-administered medications prior to visit.    No Known Allergies  ROS Review of Systems Negative except as mentioned in HPI and assessment and plan.   Objective:     BP 120/66 (BP Location: Left Arm, Patient Position: Sitting, Cuff Size: Normal)   Pulse 70   Temp 98.2 F (36.8 C) (Oral)   Ht 5\' 1"  (1.549 m)   Wt 150 lb 14.4 oz (68.4 kg)   LMP 02/01/2018   BMI 28.51 kg/m  Wt Readings from Last 3 Encounters:  05/12/20 150 lb 14.4 oz (68.4 kg)  02/04/20 146 lb 12.8 oz (66.6 kg)  10/22/19 145 lb 6.4 oz (66 kg)   Physical Exam Constitutional:      General: She is not in acute distress.    Appearance: Normal appearance. She is not ill-appearing.  HENT:     Head: Normocephalic and atraumatic.  Eyes:     General:        Right eye: No discharge.        Left eye: No discharge.     Extraocular Movements: Extraocular movements intact.     Conjunctiva/sclera: Conjunctivae normal.     Pupils: Pupils are equal, round, and reactive to light.     Comments: Mild tenderness on zygoma  Cardiovascular:     Rate and Rhythm: Normal rate and regular rhythm.     Pulses: Normal pulses.     Heart sounds: Normal heart sounds. No murmur heard.   Pulmonary:     Effort: Pulmonary effort is normal. No respiratory distress.     Breath sounds: Normal breath sounds. No stridor. No wheezing.  Abdominal:     Palpations: Abdomen is soft.     Tenderness: There is no abdominal tenderness. There is no guarding.  Musculoskeletal:     Right lower leg: No edema.     Left lower leg: No edema.  Skin:    General: Skin is warm and dry.     Findings: No rash.  Neurological:     General: No focal deficit present.     Mental Status: She is alert and oriented to person, place, and time.  Psychiatric:        Mood and Affect: Mood normal.        Behavior: Behavior normal.      Health Maintenance Due    Topic Date Due  . Hepatitis C Screening  Never done  . COVID-19 Vaccine (1) Never done  . HIV Screening  Never done  . TETANUS/TDAP  Never done  . COLONOSCOPY  Never done  .  INFLUENZA VACCINE  02/01/2020    There are no preventive care reminders to display for this patient.  Lab Results  Component Value Date   TSH 2.035 06/01/2014   Lab Results  Component Value Date   WBC 14.1 (H) 05/04/2018   HGB 14.3 05/04/2018   HCT 44.3 05/04/2018   MCV 85.7 05/04/2018   PLT 247 05/04/2018   Lab Results  Component Value Date   NA 137 05/04/2018   K 4.4 05/04/2018   CO2 25 05/04/2018   GLUCOSE 105 (H) 10/01/2019   BUN 15 05/04/2018   CREATININE 0.69 05/04/2018   BILITOT 0.7 06/08/2017   ALKPHOS 65 06/08/2017   AST 17 06/08/2017   ALT 16 06/08/2017   PROT 7.1 06/08/2017   ALBUMIN 4.1 06/08/2017   CALCIUM 8.9 05/04/2018   ANIONGAP 6 05/04/2018   Lab Results  Component Value Date   CHOL 213 (H) 10/01/2019   Lab Results  Component Value Date   HDL 42 10/01/2019   Lab Results  Component Value Date   LDLCALC 153 (H) 10/01/2019   Lab Results  Component Value Date   TRIG 97 10/01/2019   Lab Results  Component Value Date   CHOLHDL 5.1 04/16/2014   Lab Results  Component Value Date   HGBA1C 5.5 10/01/2019      Assessment & Plan:   Problem List Items Addressed This Visit      Cardiovascular and Mediastinum   Other migraine without status migrainosus, not intractable - Primary   Relevant Orders   Ambulatory referral to Neurology     Other   Blurred vision, right eye    This is an uncontrolled problem, chronic but still under work up. She reports having "issue with her rt ey" since she had a sever migraine attack couple of months ago.  (explained in last assessment and plan). Reports some facial pain. No numbness, tingling or weakness. She now says that the vision issue is constant. She describes it as a shadow that is is all over her visual field when she look  around. Also feels foreign body.   Gross ophthalmologic exam and vision is unremarkable.  Slight facial tenderness around her rt eye (not on her sinuses and the pain does not get worse when she bends pain No erythema or skin changes. No deficit of visual field. EOM is intact. Neurology exam is intact. Unclear etiology of her symptoms, differentials are migraine w aura, occular migraine. Less likely but still on differentials: retinal detachment, MS, sinusitis. Patient was seen at [facility] and per her report, they did not find any abnormality. She would like to be seen by a specialist to know what is going on.  I talked to our financial counselor regarding status of pt's orange card application: Patient can not get orange card because based on her income, she is overqualified. How ever, she has received the acceptance for 75% coverage for the services within [facility] health network. This will not cover the ophthalmology referral but should be good for the neurology visit.  -Will refer her to a neurologist for further evaluation. Patient is informed that she should have the charity letter with her that visit. -Continue migraine medications       Other Visit Diagnoses    Vision disturbance       Relevant Orders   Ambulatory referral to Neurology      No orders of the defined types were placed in this encounter.   Follow-up: No follow-ups on file.  Dewayne Hatch, MD

## 2020-05-12 NOTE — Patient Instructions (Addendum)
Gracias por permitirnos brindarle atencin hoy.  Lo referir a Diplomatic Services operational officer por su problema de ojo derecho que comenz despus de su ataque de migraa. Alguien de su oficina lo llamar para programar la cita. Por favor llvese la carta de caridad. Hoy no hicimos cambios en sus medicamentos. Haga un seguimiento con nosotros en 2-3 meses o antes si es necesario. As always, you having any sever eye pain, vision change or sever headache, go to emergency room or call 911. Si tiene alguna pregunta o inquietud, llame a la clnica de medicina interna al [phone].   Thank you for allowing [place] to provide your care today.  I will refer you to a neurologist for your rigght eye problem that started after your migraine attack. Someone from their office will call you for the appointment. Please take the charity letter with you.  Today we made no changes to your medications.  Please follow-up with [place] in 2-3 months or sooner if needed. As always, you if you develope any sever eye pain, vision change or sever headache, go to emergency room or call 911.  Should you have any questions or concerns please call the internal medicine clinic at [phone].

## 2020-05-13 NOTE — Assessment & Plan Note (Addendum)
This is an uncontrolled problem, chronic but still under work up. She reports having "issue with her rt ey" since she had a sever migraine attack couple of months ago.  (explained in last assessment and plan). Reports some facial pain. No numbness, tingling or weakness. She now says that the vision issue is constant. She describes it as a shadow that is is all over her visual field when she look around. Also feels foreign body.   Gross ophthalmologic exam and vision is unremarkable.  Slight facial tenderness around her rt eye (not on her sinuses and the pain does not get worse when she bends pain No erythema or skin changes. No deficit of visual field. EOM is intact. Neurology exam is intact. Unclear etiology of her symptoms, differentials are migraine w aura, occular migraine. Less likely but still on differentials: retinal detachment, MS, sinusitis. Patient was seen at [facility] and per her report, they did not find any abnormality. She would like to be seen by a specialist to know what is going on.  I talked to our financial counselor regarding status of pt's orange card application: Patient can not get orange card because based on her income, she is overqualified. How ever, she has received the acceptance for 75% coverage for the services within [facility] health network. This will not cover the ophthalmology referral but should be good for the neurology visit.  -Will refer her to a neurologist for further evaluation. Patient is informed that she should have the charity letter with her that visit. -Continue migraine medications

## 2020-05-13 NOTE — Progress Notes (Signed)
Internal Medicine Clinic Attending  Case discussed with Dr. Masoudi  At the time of the visit.  We reviewed the resident's history and exam and pertinent patient test results.  I agree with the assessment, diagnosis, and plan of care documented in the resident's note.  

## 2020-05-19 ENCOUNTER — Encounter: Payer: Self-pay | Admitting: *Deleted

## 2020-07-27 ENCOUNTER — Telehealth: Payer: Self-pay

## 2020-07-27 NOTE — Telephone Encounter (Signed)
Left message with patient via interpreter Erika McReynolds about conducting [facility] follow-up left name and number for her to call back. °

## 2020-07-28 ENCOUNTER — Other Ambulatory Visit: Payer: Self-pay

## 2020-07-28 ENCOUNTER — Telehealth: Payer: Self-pay

## 2020-07-28 DIAGNOSIS — Z1231 Encounter for screening mammogram for malignant neoplasm of breast: Secondary | ICD-10-CM

## 2020-07-28 NOTE — Telephone Encounter (Signed)
Health Coaching 3  interpreter- [name], [facility]   Goals- Patient has eliminated fried foods from diet. Patient has added some whole grains in regular diet (oatmeal and brown rice).    New goal- Patient has not been able to exercise outdoors due to colder weather.   Barrier to reaching goal- Colder temperatures outside not ideal for exercising.   Strategies to overcome- Find indoor exercises/activities that can be done until warmer weather returns. (Yoga/streches, cardio/zumba videos, etc. )   Navigation:  Patient is aware of  a follow up session. Patient is scheduled for follow-up visit on August 18, 2020 @ 3:00 pm.   Time- 12 minutes

## 2020-07-28 NOTE — Telephone Encounter (Signed)
Attempted to return patient's phone call via Spanish interpreter [name]. Left name and number for patient to call back.

## 2020-08-03 NOTE — Progress Notes (Unsigned)
[facility]    Provider:  Dr Lucia Gaskins Requesting Provider: Miguel Aschoff, MD, Charissa Bash MD Primary Care Provider:  Chevis Pretty, MD  CC:  Vision loss  HPI:  Heidi Rogers is a 55 y.o. female here as requested by Miguel Aschoff, MD for migraines and vision disturbance.  Past medical history migraines, hypertension, elevated glucose and cholesterol, blurred vision in the right eye.  I reviewed Charissa Bash MD's notes: She presented for follow-up of the vision issue, translator assisted with interpretation, she reported having an issue with her right eye ever since she had a severe migraine attack several months prior, reports some facial pain, no numbness tingling or weakness, patient reported the vision issue is constant, a shadow that is all over her visual field, also feels there is a foreign body there, exam shows slight facial tenderness around her right eye, no deficit of visual field, extraocular movements intact, neurologic exam intact, she saw ophthalmology and per report they did not find any abnormality.  Patient is also presented to the emergency room several times in the past for headache, worse on the right side, history of migraines, mild blurry vision and photophobia.  Patient is here with an interpreter Marlen and with her daughter who provides information, she reports she has migraines, has had them since she was about 55 years old, she recently started having visual problems, a right eye shadow , it is constant always there, eye hurts above and below and some days it waters too. In March of 2020 she felt like something fell in her eye, also a shadow on her eye, a circle in the middle, a little dark, she still had vision, it never went away, left eye is fine, only in the right eye, this eye problem is not associated with headaches currently but did occur in the setting of a severe migraine, she has never had this type of visual symptoms with  her headaches prior. She had a very strong headache which was her usual migraine but more severe, worst headache of her life, then she had the eye situation occur. Not getting better, not getting worse, it is always there. The right eye is different, it feels different, feels like the right eye is bigger and the right eye hurts to move it and also watery. She went to the eye doctor and they prescribed 2 different medications but found nothing wrong and the medication did not help (they ave her restasis and prednisone drops) which did not help. The pain in the eye hurts "inside" but also below the right eye (point to maxillary area) and around the whole right eye. No fevers or chills or jaw pain.  Reviewed notes, labs and imaging from outside physicians, which showed:  From a review of records, medications tried in the past that can be used in migraine management includes Tylenol, Decadron injection, Benadryl injection, ibuprofen, ketorolac injection, meclizine tablet, metoclopramide injection and oral, metoprolol, Zofran tablets, Compazine tablets, Imitrex,  CT head 05/04/2018: : There is no mass, hemorrhage or extra-axial collection. The size and configuration of the ventricles and extra-axial CSF spaces are normal. The brain parenchyma is normal, without evidence of acute or chronic infarction. There is no colloid cyst. The position of the cerebellar tonsils is normal.  Vascular: No abnormal hyperdensity of the major intracranial arteries or dural venous sinuses. No intracranial atherosclerosis.  Skull: The visualized skull base, calvarium and extracranial soft tissues are normal.  Sinuses/Orbits: No fluid levels or advanced  mucosal thickening of the visualized paranasal sinuses. No mastoid or middle ear effusion. The orbits are normal.  IMPRESSION: Normal head CT. (personally reviewed images and agree)  Review of Systems: Patient complains of symptoms per HPI as well as the following  symptoms: headache. Pertinent negatives and positives per HPI. All others negative.   Social History   Socioeconomic History  . Marital status: Single    Spouse name: Not on file  . Number of children: 5  . Years of education: Not on file  . Highest education level: 2nd grade  Occupational History  . Not on file  Tobacco Use  . Smoking status: Never Smoker  . Smokeless tobacco: Never Used  Vaping Use  . Vaping Use: Never used  Substance and Sexual Activity  . Alcohol use: No  . Drug use: Not Currently  . Sexual activity: Yes    Birth control/protection: Surgical  Other Topics Concern  . Not on file  Social History Narrative   Lives with her daughter   Right handed   Caffeine: 1-2 cups coffee/day   Social Determinants of Health   Financial Resource Strain: Not on file  Food Insecurity: Not on file  Transportation Needs: No Transportation Needs  . Lack of Transportation (Medical): No  . Lack of Transportation (Non-Medical): No  Physical Activity: Not on file  Stress: Not on file  Social Connections: Not on file  Intimate Partner Violence: Not on file    Family History  Problem Relation Age of Onset  . Migraines Neg Hx     Past Medical History:  Diagnosis Date  . High cholesterol   . Hypertension   . Migraines     Patient Active Problem List   Diagnosis Date Noted  . Blurred vision, right eye 02/05/2020  . Elevated cholesterol 10/22/2019  . Other migraine without status migrainosus, not intractable 06/01/2014  . Elevated blood sugar 06/01/2014  . Pain of left breast 04/14/2014    Past Surgical History:  Procedure Laterality Date  . TUBAL LIGATION      Current Outpatient Medications  Medication Sig Dispense Refill  . acetaminophen (TYLENOL 8 HOUR) 650 MG CR tablet Take 1 tablet (650 mg total) by mouth every 8 (eight) hours as needed for pain. 30 tablet 0   No current facility-administered medications for this visit.    Allergies as of 08/04/2020   . (No Known Allergies)    Vitals: BP 122/78 (BP Location: Right Arm, Patient Position: Sitting)   Pulse 68   Ht 4\' 11"  (1.499 m)   Wt 152 lb (68.9 kg)   LMP 02/01/2018   SpO2 97%   BMI 30.70 kg/m  Last Weight:  Wt Readings from Last 1 Encounters:  08/04/20 152 lb (68.9 kg)   Last Height:   Ht Readings from Last 1 Encounters:  08/04/20 4\' 11"  (1.499 m)     Physical exam: Exam: Gen: NAD, conversant, well nourised, obese, well groomed                     CV: RRR, no MRG. No Carotid Bruits. No peripheral edema, warm, nontender Eyes: Conjunctivae clear without exudates or hemorrhage  Neuro: Detailed Neurologic Exam  Speech:    Speech is normal; fluent and spontaneous with normal comprehension.  Cognition:    The patient is oriented to person, place, and time;     recent and remote memory intact;     language fluent;     normal attention, concentration,  fund of knowledge Cranial Nerves:    The pupils are equal, round, and reactive to light. The fundi are flat. Visual fields are full to finger confrontation. Extraocular movements are intact. Trigeminal sensation is intact and the muscles of mastication are normal. The face is symmetric. The palate elevates in the midline. Hearing intact. Voice is normal. Shoulder shrug is normal. The tongue has normal motion without fasciculations.   Coordination:    Normal finger to nose and heel to shin. Normal rapid alternating movements.   Gait:    Heel-toe and tandem gait are normal.   Motor Observation:    No asymmetry, no atrophy, and no involuntary movements noted. Tone:    Normal muscle tone.    Posture:    Posture is normal. normal erect    Strength:    Strength is V/V in the upper and lower limbs.      Sensation: intact to LT     Reflex Exam:  DTR's:    Deep tendon reflexes in the upper and lower extremities are normal bilaterally.   Toes:    The toes are downgoing bilaterally.   Clonus:    Clonus is  absent.    Assessment/Plan:  55 year old female with acute onset monocular vision central loss since March 20th in the setting of a severe headache. She had a hx of migraines but never had visual changes with the migraines. Would be highly unusual for this to be persistent migraine aura for over a year. Need imaging of the brain and orbits and blood work today.   MRI of the brain and orbits due to monocular vision changes, headache, eye pain to evaluate for optic neuropathy, optic lesions, space occupying/compressive mass, metastasis, stroke, or other. Need thin cuts through the orbits Have reached out to Dr. Laruth Bouchard office to get notes(ophtho).    Orders Placed This Encounter  Procedures  . MR BRAIN W WO CONTRAST  . MR ORBITS W WO CONTRAST  . CBC with Differential/Platelets  . Comprehensive metabolic panel  . TSH  . T4, Free  . Hemoglobin A1c  . Sedimentation rate  . C-reactive protein  . Thyroglobulin antibody  . Thyroid peroxidase antibody     Cc: Miguel Aschoff, MD,  Chevis Pretty, MD, Charissa Bash MD  Naomie Dean, MD  [facility] [address] [place], [place] [address]  I spent over 100 minutes of face-to-face and non-face-to-face time with patient on the  1. Monocular vision loss   2. Pain around right eye   3. Right temporal headache   4. Worsening headaches    diagnosis.  This included previsit chart review, lab review, study review, order entry, electronic health record documentation, patient education on the different diagnostic and therapeutic options, counseling and coordination of care, risks and benefits of management, compliance, or risk factor reduction   Phone [phone] Fax [phone]  I spent over 90  minutes of face-to-face and non-face-to-face time with patient on the  1. Monocular vision loss   2. Pain around right eye   3. Right temporal headache   4. Worsening headaches    diagnosis.  This  included previsit chart review, lab review, study review, order entry, electronic health record documentation, patient education on the different diagnostic and therapeutic options, counseling and coordination of care, risks and benefits of management, compliance, or risk factor reduction

## 2020-08-04 ENCOUNTER — Ambulatory Visit: Payer: Self-pay | Admitting: Neurology

## 2020-08-04 ENCOUNTER — Encounter: Payer: Self-pay | Admitting: Neurology

## 2020-08-04 ENCOUNTER — Telehealth: Payer: Self-pay | Admitting: Neurology

## 2020-08-04 ENCOUNTER — Other Ambulatory Visit: Payer: Self-pay

## 2020-08-04 VITALS — BP 122/78 | HR 68 | Ht 59.0 in | Wt 152.0 lb

## 2020-08-04 DIAGNOSIS — R519 Headache, unspecified: Secondary | ICD-10-CM

## 2020-08-04 DIAGNOSIS — H5460 Unqualified visual loss, one eye, unspecified: Secondary | ICD-10-CM

## 2020-08-04 DIAGNOSIS — H5711 Ocular pain, right eye: Secondary | ICD-10-CM

## 2020-08-04 NOTE — Telephone Encounter (Signed)
cone assistance (exp. 08/27/20) order sent to GI. They will reach out to the patient to schedule.

## 2020-08-04 NOTE — Patient Instructions (Signed)
MRI of the brain and eyes Blood work Will see you back for follow up

## 2020-08-05 LAB — HEMOGLOBIN A1C
Est. average glucose Bld gHb Est-mCnc: 111 mg/dL
Hgb A1c MFr Bld: 5.5 % (ref 4.8–5.6)

## 2020-08-05 LAB — COMPREHENSIVE METABOLIC PANEL
ALT: 19 IU/L (ref 0–32)
AST: 13 IU/L (ref 0–40)
Albumin/Globulin Ratio: 1.8 (ref 1.2–2.2)
Albumin: 4.4 g/dL (ref 3.8–4.9)
Alkaline Phosphatase: 121 IU/L (ref 44–121)
BUN/Creatinine Ratio: 20 (ref 9–23)
BUN: 10 mg/dL (ref 6–24)
Bilirubin Total: 0.4 mg/dL (ref 0.0–1.2)
CO2: 22 mmol/L (ref 20–29)
Calcium: 9.1 mg/dL (ref 8.7–10.2)
Chloride: 104 mmol/L (ref 96–106)
Creatinine, Ser: 0.51 mg/dL — ABNORMAL LOW (ref 0.57–1.00)
GFR calc Af Amer: 127 mL/min/{1.73_m2} (ref >59)
GFR calc non Af Amer: 110 mL/min/{1.73_m2} (ref >59)
Globulin, Total: 2.4 g/dL (ref 1.5–4.5)
Glucose: 92 mg/dL (ref 65–99)
Potassium: 4.2 mmol/L (ref 3.5–5.2)
Sodium: 138 mmol/L (ref 134–144)
Total Protein: 6.8 g/dL (ref 6.0–8.5)

## 2020-08-05 LAB — CBC WITH DIFFERENTIAL/PLATELET
Basophils Absolute: 0.1 10*3/uL (ref 0.0–0.2)
Basos: 1 %
EOS (ABSOLUTE): 0.1 10*3/uL (ref 0.0–0.4)
Eos: 2 %
Hematocrit: 42.8 % (ref 34.0–46.6)
Hemoglobin: 14.1 g/dL (ref 11.1–15.9)
Immature Grans (Abs): 0 10*3/uL (ref 0.0–0.1)
Immature Granulocytes: 0 %
Lymphocytes Absolute: 1.9 10*3/uL (ref 0.7–3.1)
Lymphs: 27 %
MCH: 27.9 pg (ref 26.6–33.0)
MCHC: 32.9 g/dL (ref 31.5–35.7)
MCV: 85 fL (ref 79–97)
Monocytes Absolute: 0.6 10*3/uL (ref 0.1–0.9)
Monocytes: 9 %
Neutrophils Absolute: 4.4 10*3/uL (ref 1.4–7.0)
Neutrophils: 61 %
Platelets: 258 10*3/uL (ref 150–450)
RBC: 5.05 x10E6/uL (ref 3.77–5.28)
RDW: 12.7 % (ref 11.7–15.4)
WBC: 7.1 10*3/uL (ref 3.4–10.8)

## 2020-08-05 LAB — THYROID PEROXIDASE ANTIBODY: Thyroperoxidase Ab SerPl-aCnc: <8 IU/mL (ref 0–34)

## 2020-08-05 LAB — C-REACTIVE PROTEIN: CRP: 1 mg/L (ref 0–10)

## 2020-08-05 LAB — T4, FREE: Free T4: 1.38 ng/dL (ref 0.82–1.77)

## 2020-08-05 LAB — SEDIMENTATION RATE: Sed Rate: 8 mm/hr (ref 0–40)

## 2020-08-05 LAB — THYROGLOBULIN ANTIBODY: Thyroglobulin Antibody: <1 IU/mL (ref 0.0–0.9)

## 2020-08-05 LAB — TSH: TSH: 2.15 u[IU]/mL (ref 0.450–4.500)

## 2020-08-18 ENCOUNTER — Ambulatory Visit
Admission: RE | Admit: 2020-08-18 | Discharge: 2020-08-18 | Disposition: A | Discharge status: Home / Self Care | Payer: No Typology Code available for payment source | Source: Ambulatory Visit | Attending: Neurology | Admitting: Neurology

## 2020-08-18 ENCOUNTER — Inpatient Hospital Stay: Payer: Self-pay | Attending: Obstetrics and Gynecology | Admitting: *Deleted

## 2020-08-18 ENCOUNTER — Other Ambulatory Visit: Payer: Self-pay

## 2020-08-18 VITALS — BP 118/72 | Ht 60.0 in | Wt 151.0 lb

## 2020-08-18 DIAGNOSIS — H5711 Ocular pain, right eye: Secondary | ICD-10-CM

## 2020-08-18 DIAGNOSIS — Z0000 Encounter for general adult medical examination without abnormal findings: Secondary | ICD-10-CM

## 2020-08-18 DIAGNOSIS — H5460 Unqualified visual loss, one eye, unspecified: Secondary | ICD-10-CM

## 2020-08-18 DIAGNOSIS — R519 Headache, unspecified: Secondary | ICD-10-CM

## 2020-08-18 MED ORDER — GADOBENATE DIMEGLUMINE 529 MG/ML IV SOLN
10.0000 mL | Freq: Once | INTRAVENOUS | Status: AC | PRN
  Administered 2020-08-18: 10 mL via INTRAVENOUS

## 2020-08-18 NOTE — Progress Notes (Signed)
[facility] follow up   Interpreter: [name], [employer]  Clinical Measurement:   Vitals:   08/18/20 1448  BP: 120/72      Medical History:  Patient states that she has high cholesterol, does not have high blood pressure and she does not have diabetes.  Medications:  Patient states that she does not take medication to lower cholesterol, blood pressure or blood sugar.  Patient does not take an aspirin a day to help prevent a heart attack or stroke.    Blood pressure, self measurement: Patient states that she does not measure blood pressure from home. She checks her blood pressure N/A. She shares her readings with a health care provider: N/A.   Nutrition: Patient states that on average she eats 1 cups of fruit and 1 cups of vegetables per day. Patient states that she does not eat fish at least 2 times per week. Patient eats less than half servings of whole grains. Patient drinks less than 36 ounces of beverages with added sugar weekly: yes. Patient is currently watching sodium or salt intake: yes. In the past 7 days patient has had 0 drinks containing alcohol. On average patient drinks 0 drinks containing alcohol per day.      Physical activity:  Patient states that she gets 70 minutes of moderate and 0 minutes of vigorous physical activity each week.  Smoking status:  Patient states that she has has never smoked .   Quality of life:  Over the past 2 weeks patient states that she had little interest or pleasure in doing things: not at all. She has been feeling down, depressed or hopeless:not at all.    Risk reduction and counseling:  Health Coaching:  Encouraged patient to continue trying to practice heart healthy diet by avoiding fried and fatty foods, adding in more whole grains and heart healthy fish. Patient currently consumes 1 fruit and 1 vegetable per day. Explained to patient that the recommendation is for 2 cups of fruit and 3 cups of vegetables per day. Encouraged patient to also  watch the amount of sweets and sugars that she consumes since glucose was slightly elevated during initial screening. Patient exercises about 10 minutes per day. Encouraged patient to try and get in 20 minutes of daily exercise if possible.    Navigation: This was the  follow up session for this patient, I will check up on her progress in the coming months. Referred [facility] to [facility] to follow-up for AUB. Patient has appointment scheduled on September 08, 2020. Patient is scheduled for re-screening appointment on October 06, 2020.  Time: 30 minutes

## 2020-08-26 ENCOUNTER — Telehealth: Payer: Self-pay | Admitting: *Deleted

## 2020-08-26 NOTE — Telephone Encounter (Addendum)
-----   Message from Anson Fret, MD sent at 08/21/2020 12:28 PM EST ----- MRI orbits and brain appear unremarkable but we will have to repeat them in 3-6 months because of a spot on the MRI that we think is just artifact but we should repeat. I will make a reminder and we will call her in 3 months  Anson Fret, MD  08/21/2020 12:27 PM EST Back to Top     MRI of the brain is unremarkable but we will need to repeat it in 3-6 months.

## 2020-08-26 NOTE — Telephone Encounter (Signed)
Called pt via pac[employer] interpre[employer] and Isabelle Course (Spanish interpreter) LVM asking for pt to call us back.

## 2020-09-01 NOTE — Telephone Encounter (Signed)
Used pacific interpreters (ID # [id number]) to call patient.  The interpreter left a detailed voicemail (ok per DPR) on patient's phone advising of MRI brain and orbit results and that we need to repeat the imaging in 3 to 6 months to recheck a spot that may be artifact.  The interpreter also call the daughter Renea Ee (on [place]) and left voicemail asking for call back.  We will try to reach the patient once more and then send a letter to her home if unable to reach her.

## 2020-09-01 NOTE — Telephone Encounter (Signed)
Pt's daughter Renea Ee returned my call. We discussed the results from Dr Lucia Gaskins of the MRI brain and orbits. She understands the MRI brain and orbits were unremarkable overall but there was a spot that we need to recheck in about 3 months that might possibly be artifact.  I let her know that we would call in 3 months as a reminder into make sure they are okay with ordering the MRI.   I offered to call an interpreter to speak with the patient but the daughter said she felt comfortable relaying the results to the patient.  She will let us know if they have any further questions. The patient does have an appointment with Dr. Lucia Gaskins on the 30th of this month and they are planning on coming.

## 2020-09-08 ENCOUNTER — Encounter: Payer: No Typology Code available for payment source | Admitting: Family Medicine

## 2020-09-08 ENCOUNTER — Telehealth: Payer: Self-pay | Admitting: *Deleted

## 2020-09-08 NOTE — Telephone Encounter (Signed)
[facility] referral from [facility] for bleeding. I called Naydeline with interpreter Eda Royal and left message she missed scheduled appointment and is important she be seen; please call office and reschedule appointment. Linda,RN

## 2020-09-27 ENCOUNTER — Other Ambulatory Visit: Payer: Self-pay

## 2020-09-27 ENCOUNTER — Encounter: Payer: Self-pay | Admitting: Obstetrics and Gynecology

## 2020-09-27 ENCOUNTER — Ambulatory Visit (INDEPENDENT_AMBULATORY_CARE_PROVIDER_SITE_OTHER): Payer: Self-pay | Admitting: Obstetrics and Gynecology

## 2020-09-27 DIAGNOSIS — N950 Postmenopausal bleeding: Secondary | ICD-10-CM | POA: Insufficient documentation

## 2020-09-27 NOTE — Progress Notes (Signed)
Referred from [facility] for bleeding issues. States had not had period for 3 years then last month  bled 3 days and felt dizzy after that. Also c/o abdominal above umbilicus sometimes=8 and states can't walk .  She turned in financial assistance application today. Gaege Sangalang,RN

## 2020-09-27 NOTE — Progress Notes (Signed)
Patient ID: Heidi Rogers, female   DOB: 05/15/1966, 55 y.o.   MRN: [id number] Pt seen in referral from [facility] for PMB. Pt reports 3 days of bleeding this past Feb. No prior cycle for 3 yrs prior  Pap smear and mammogram with [facility]  PE AF VSS Lungs clear Heart RRR Abd soft + BS  A/P PMB  Pt turned in financial aid package today. Discussed need for [facility] and pelvic U/S. Offer to complete and order today and that if financial aid approve costs would be covered retrospective. Pt desires to want for approval before having procedure and U/S. Pt to contact front office once approval obtained and schedule f/u appt with me. Live interrupter used during today's visit.

## 2020-09-27 NOTE — Patient Instructions (Signed)
Hemorragia posmenopusica Postmenopausal Bleeding La hemorragia posmenopusica es cualquier sangrado que ocurre despus de la menopausia. La menopausia es un momento de la vida de una mujer en el que se detienen los perodos [place]. El mdico debe controlar cualquier tipo de sangrado que tenga despus de la menopausia. El tratamiento depender de la causa. Este tipo de hemorragia puede ser causada por:  El uso de hormonas durante la menopausia.  Cantidades bajas o altas de hormonas femeninas en el cuerpo. Esto puede hacer que el revestimiento del utero se vuelva demasiado delgado o demasiado grueso.  Cncer.  Crecimientos en el tero que no son cncer. Siga estas instrucciones en su casa:  Controle si hay algn cambio en sus sntomas. Informe a su mdico acerca de los cambios.  Evite usar tampones y Geographical information systems officer duchas vaginales como se lo haya indicado el mdico.  Cmbiese las toallas higinicas de forma regular.  Hgase exmenes plvicos regulares. Esto incluye las pruebas de [place].  Tome comprimidos de hierro como se lo haya indicado el mdico.  Use los medicamentos de venta libre y los recetados solamente como se lo haya indicado el mdico.  Cumpla con todas las visitas de seguimiento.   Comunquese con un mdico si:  Tiene un nuevo sangrado de la vagina despus de la menopausia.  Siente dolor en el vientre (abdomen). Solicite ayuda de inmediato si:  Tiene fiebre o escalofros.  Tiene un dolor muy intenso con sangrado.  Elimina grumos de sangre (cogulos de [place]) por la vagina.  Tiene mucho sangrado y: ? [place] ms de 1 compresa por hora. ? Es la primera vez que tiene un sangrado de [place] tipo.  Tiene dolores de [place].  Se siente mareado o como si se fuera a desmayar. Resumen  El mdico debe controlar cualquier tipo de sangrado que tenga despus de la menopausia.  Evite usar tampones o duchas vaginales.  Hgase exmenes plvicos regulares. Esto incluye las  pruebas de [place].  Pngase en contacto con un mdico si tiene sangrado o dolor nuevo en el vientre.  Controle si hay algn cambio en sus sntomas. Informe a su mdico acerca de los cambios. Esta informacin no tiene Theme park manager el consejo del mdico. Asegrese de hacerle al mdico cualquier pregunta que tenga. Document Revised: 01/20/2020 Document Reviewed: 01/20/2020 Elsevier Patient Education  2021 [employer].

## 2020-09-28 ENCOUNTER — Telehealth: Payer: Self-pay | Admitting: Neurology

## 2020-09-28 NOTE — Progress Notes (Signed)
[facility]    Provider:  Dr Lucia Gaskins Requesting Provider: Chevis Pretty, *, Charissa Bash MD Primary Care Provider:  Chevis Pretty, MD  CC:  Vision loss  She dropped off the application and hasn;t heard about financial assistance.   She is getting migraines every 2-3 months. The migraines are not that bad. She takes advil/tylenol. She is still having the vision changes, nothing has changed, she feels like the shadow is growing every day, she has been like this for 2 years. She was supposed to go back to see Dr. Noel Gerold at [facility] but never did, we will make her an appoitnment while she is here. If they would like her to still see [facility] I would ask that they re-refer her thanks. I called [facility],   HPI:  Heidi Rogers is a 55 y.o. female here as requested by [facility], Elhamalsadat, * for migraines and vision disturbance.  Past medical history migraines, hypertension, elevated glucose and cholesterol, blurred vision in the right eye.  I reviewed Charissa Bash MD's notes: She presented for follow-up of the vision issue, translator assisted with interpretation, she reported having an issue with her right eye ever since she had a severe migraine attack several months prior, reports some facial pain, no numbness tingling or weakness, patient reported the vision issue is constant, a shadow that is all over her visual field, also feels there is a foreign body there, exam shows slight facial tenderness around her right eye, no deficit of visual field, extraocular movements intact, neurologic exam intact, she saw ophthalmology and per report they did not find any abnormality.  Patient is also presented to the emergency room several times in the past for headache, worse on the right side, history of migraines, mild blurry vision and photophobia.  Patient is here with an interpreter Marlen and with her daughter who provides information, she reports she has  migraines, has had them since she was about 55 years old, she recently started having visual problems, a right eye shadow , it is constant always there, eye hurts above and below and some days it waters too. In March of 2020 she felt like something fell in her eye, also a shadow on her eye, a circle in the middle, a little dark, she still had vision, it never went away, left eye is fine, only in the right eye, this eye problem is not associated with headaches currently but did occur in the setting of a severe migraine, she has never had this type of visual symptoms with her headaches prior. She had a very strong headache which was her usual migraine but more severe, worst headache of her life, then she had the eye situation occur. Not getting better, not getting worse, it is always there. The right eye is different, it feels different, feels like the right eye is bigger and the right eye hurts to move it and also watery. She went to the eye doctor and they prescribed 2 different medications but found nothing wrong and the medication did not help (they ave her restasis and prednisone drops) which did not help. The pain in the eye hurts "inside" but also below the right eye (point to maxillary area) and around the whole right eye. No fevers or chills or jaw pain.  Reviewed notes, labs and imaging from outside physicians, which showed:  From a review of records, medications tried in the past that can be used in migraine management includes Tylenol, Decadron injection,  Benadryl injection, ibuprofen, ketorolac injection, meclizine tablet, metoclopramide injection and oral, metoprolol, Zofran tablets, Compazine tablets, Imitrex,  CT head 05/04/2018: : There is no mass, hemorrhage or extra-axial collection. The size and configuration of the ventricles and extra-axial CSF spaces are normal. The brain parenchyma is normal, without evidence of acute or chronic infarction. There is no colloid cyst. The position of the  cerebellar tonsils is normal.  Vascular: No abnormal hyperdensity of the major intracranial arteries or dural venous sinuses. No intracranial atherosclerosis.  Skull: The visualized skull base, calvarium and extracranial soft tissues are normal.  Sinuses/Orbits: No fluid levels or advanced mucosal thickening of the visualized paranasal sinuses. No mastoid or middle ear effusion. The orbits are normal.  IMPRESSION: Normal head CT. (personally reviewed images and agree)  Review of Systems: Patient complains of symptoms per HPI as well as the following symptoms: headache. Pertinent negatives and positives per HPI. All others negative.   Social History   Socioeconomic History  . Marital status: Single    Spouse name: Not on file  . Number of children: 5  . Years of education: Not on file  . Highest education level: 2nd grade  Occupational History  . Not on file  Tobacco Use  . Smoking status: Never Smoker  . Smokeless tobacco: Never Used  Vaping Use  . Vaping Use: Never used  Substance and Sexual Activity  . Alcohol use: No  . Drug use: Not Currently  . Sexual activity: Not Currently    Birth control/protection: Surgical, Post-menopausal  Other Topics Concern  . Not on file  Social History Narrative   Lives with her daughter   Right handed   Caffeine: 2 cups coffee/day   Social Determinants of Health   Financial Resource Strain: Not on file  Food Insecurity: No Food Insecurity  . Worried About Programme researcher, broadcasting/film/video in the Last Year: Never true  . Ran Out of Food in the Last Year: Never true  Transportation Needs: No Transportation Needs  . Lack of Transportation (Medical): No  . Lack of Transportation (Non-Medical): No  Physical Activity: Not on file  Stress: Not on file  Social Connections: Not on file  Intimate Partner Violence: Not on file    Family History  Problem Relation Age of Onset  . Migraines Neg Hx     Past Medical History:  Diagnosis Date   . High cholesterol   . Hypertension   . Migraines     Patient Active Problem List   Diagnosis Date Noted  . PMB (postmenopausal bleeding) 09/27/2020  . Blurred vision, right eye 02/05/2020  . Elevated cholesterol 10/22/2019  . Other migraine without status migrainosus, not intractable 06/01/2014  . Elevated blood sugar 06/01/2014  . Pain of left breast 04/14/2014    Past Surgical History:  Procedure Laterality Date  . TUBAL LIGATION      Current Outpatient Medications  Medication Sig Dispense Refill  . acetaminophen (TYLENOL 8 HOUR) 650 MG CR tablet Take 1 tablet (650 mg total) by mouth every 8 (eight) hours as needed for pain. 30 tablet 0   No current facility-administered medications for this visit.    Allergies as of 09/29/2020  . (No Known Allergies)    Vitals: BP 121/78 (BP Location: Right Arm, Patient Position: Sitting)   Pulse 65   Ht 4\' 11"  (1.499 m)   Wt 151 lb (68.5 kg)   LMP 02/01/2018   BMI 30.50 kg/m  Last Weight:  Wt Readings from Last 1  Encounters:  09/29/20 151 lb (68.5 kg)   Last Height:   Ht Readings from Last 1 Encounters:  09/29/20 4\' 11"  (1.499 m)     Physical exam: Exam: Gen: NAD, conversant, well nourised, obese, well groomed                     CV: RRR, no MRG. No Carotid Bruits. No peripheral edema, warm, nontender Eyes: Conjunctivae clear without exudates or hemorrhage  Neuro: Detailed Neurologic Exam  Speech:    Speech is normal; fluent and spontaneous with normal comprehension.  Cognition:    The patient is oriented to person, place, and time;     recent and remote memory intact;     language fluent;     normal attention, concentration,     fund of knowledge Cranial Nerves:    The pupils are equal, round, and reactive to light. The fundi are flat. Visual fields are full to finger confrontation. Extraocular movements are intact. Trigeminal sensation is intact and the muscles of mastication are normal. The face is  symmetric. The palate elevates in the midline. Hearing intact. Voice is normal. Shoulder shrug is normal. The tongue has normal motion without fasciculations.   Coordination:    Normal finger to nose and heel to shin. Normal rapid alternating movements.   Gait:    Heel-toe and tandem gait are normal.   Motor Observation:    No asymmetry, no atrophy, and no involuntary movements noted. Tone:    Normal muscle tone.    Posture:    Posture is normal. normal erect    Strength:    Strength is V/V in the upper and lower limbs.      Sensation: intact to LT     Reflex Exam:  DTR's:    Deep tendon reflexes in the upper and lower extremities are normal bilaterally.   Toes:    The toes are downgoing bilaterally.   Clonus:    Clonus is absent.    Assessment/Plan:  55 year old female with acute onset right monocular vision central loss since March 20th in the setting of a severe headache. She had a hx of migraines but never had visual changes with the migraines. Would be highly unusual for this to be persistent migraine aura for over a year.   - MRI of the brain and orbits largely unremarkable, I think what was seen on MRI orbits was artifact (Potential subtle enhancement of the right optic nerve noted particularly on postcontrast coronal views series 6 images 12 and 14.  However this not well visualized on axial postcontrast views nor on T2 coronal views.  Question true enhancement versus artifact.  No other abnormal enhancing, inflammatory or compressive lesions are seen.) but we will repeat it in 3-6 months based on Dr. Wells Guilesohen's Finding. - however I doubt this is of clinical significance, vision changes ongoing for 2 years doubt enhancement of optic nerve for 2 years. - Dr. Noel Geroldohen wanted her to see [facility], if that is still the case she is seeing Dr. Noel Geroldohen May 5th 1pm(I called and made her an appointment) and they could refer her again. 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 -  Migraines are episodic (every 2-3 months) and respond to tylenol, no need for migraine treatment at this time.   No orders of the defined types were placed in this encounter.    Cc: Masoudi, Shawna OrleansElhamalsadat, *,  Chevis PrettyMasoudi, Elhamalsadat, MD, Charissa BashWilliams, Julie MD  Desma MaximAntonia  Lucia Gaskins, MD  [facility] [address] [place], [place] [address]  I spent over 30 minutes of face-to-face and non-face-to-face time with patient on the  1. Monocular vision loss   2. Other migraine without status migrainosus, not intractable    diagnosis.  This included previsit chart review, lab review, study review, order entry, electronic health record documentation, patient education on the different diagnostic and therapeutic options, counseling and coordination of care, risks and benefits of management, compliance, or risk factor reduction   Phone [phone] Fax [phone]

## 2020-09-28 NOTE — Telephone Encounter (Signed)
Heidi Rogers: Patient is coming in Wednesday afternoon, I need notes from Dr. Laruth Bouchard office prior to seeing her, I reached out to them but not sure I received anything can you get those for me in the morning? (bethany can you watch out for the notes and remind me in the morning please? Thanks)

## 2020-09-29 ENCOUNTER — Ambulatory Visit (INDEPENDENT_AMBULATORY_CARE_PROVIDER_SITE_OTHER): Payer: Self-pay | Admitting: Neurology

## 2020-09-29 ENCOUNTER — Encounter: Payer: Self-pay | Admitting: Neurology

## 2020-09-29 VITALS — BP 121/78 | HR 65 | Ht 59.0 in | Wt 151.0 lb

## 2020-09-29 DIAGNOSIS — H5460 Unqualified visual loss, one eye, unspecified: Secondary | ICD-10-CM

## 2020-09-29 DIAGNOSIS — G43809 Other migraine, not intractable, without status migrainosus: Secondary | ICD-10-CM

## 2020-09-29 NOTE — Patient Instructions (Signed)
Dr. Noel Gerold appointment ophthalmology May 5th at 1pm (Thursday) We will call to repeat the MRI in 3-6 months (we will call you and ask about the [facility] financial assistance so hopefully we can assist financially) We will see you after that based on ophthalmology results and MRI results

## 2020-09-29 NOTE — Telephone Encounter (Signed)
Request made

## 2020-09-29 NOTE — Telephone Encounter (Signed)
Note ready for Dr Trevor Mace review.

## 2020-09-30 ENCOUNTER — Other Ambulatory Visit: Payer: Self-pay

## 2020-09-30 ENCOUNTER — Ambulatory Visit
Admission: RE | Admit: 2020-09-30 | Discharge: 2020-09-30 | Disposition: A | Discharge status: Home / Self Care | Payer: No Typology Code available for payment source | Source: Ambulatory Visit | Attending: Obstetrics and Gynecology | Admitting: Obstetrics and Gynecology

## 2020-09-30 ENCOUNTER — Ambulatory Visit: Payer: Self-pay | Admitting: *Deleted

## 2020-09-30 VITALS — BP 138/82 | Wt 148.8 lb

## 2020-09-30 DIAGNOSIS — Z1239 Encounter for other screening for malignant neoplasm of breast: Secondary | ICD-10-CM

## 2020-09-30 NOTE — Patient Instructions (Signed)
Explained breast self awareness with [name]. Patient did not need a Pap smear today due to last Pap smear and HPV typing was 09/23/2019. Let her know [facility] will cover Pap smears and HPV typing every 5 years unless has a history of abnormal Pap smears. Referred patient to the [facility] for a screening mammogram on the mobile unit. Appointment scheduled Thursday, September 30, 2020 at 1130. Patient escorted to the mobile unit following [facility] appointment for her screening mammogram. Let patient know the Breast Center will follow up with her within the next couple weeks with results of her mammogram by letter or phone. Heidi Rogers verbalized understanding.  Abdiaziz Klahn, Kathaleen Maser, RN 10:44 AM

## 2020-09-30 NOTE — Progress Notes (Signed)
[name] is a 55 y.o. female who presents to [facility] clinic today with no complaints.    Pap Smear: Pap smear not completed today. Last Pap smear was 09/23/2019 at [facility] clinic and was normalwith negative HPV. Per patient has no history of an abnormal Pap smear. Last Pap smear result is available in Epic.   Physical exam: Breasts  Breasts symmetrical. No skin abnormalities bilateral breasts. Nipple retraction bilateral breasts that per patient is normal for her. No nipple discharge bilateral breasts. No lymphadenopathy. No lumps palpated bilateral breasts. No complaints of pain or tenderness on exam.      MS DIGITAL SCREENING TOMO BILATERAL  Result Date: 09/23/2019 CLINICAL DATA:  Screening. EXAM: DIGITAL SCREENING BILATERAL MAMMOGRAM WITH TOMO AND CAD COMPARISON:  Previous exam(s). ACR Breast Density Category c: The breast tissue is heterogeneously dense, which may obscure small masses. FINDINGS: There are no findings suspicious for malignancy. Images were processed with CAD. IMPRESSION: No mammographic evidence of malignancy. A result letter of this screening mammogram will be mailed directly to the patient. RECOMMENDATION: Screening mammogram in one year. (Code:SM-B-01Y) BI-RADS CATEGORY  1: Negative. Electronically Signed   By: Frederico Hamman M.D.   On: 09/23/2019 15:16    Pelvic/Bimanual Pap is not indicated today per [facility] guidelines.    Smoking History: Patient has never smoked.   Patient Navigation: Patient education provided. Access to services provided for patient through [place] program. Spanish interpreter [name] from [facility] provided.   Colorectal Cancer Screening: Per patient has never had colonoscopy completed. No complaints today.    Breast and Cervical Cancer Risk Assessment: Patient does not have family history of breast cancer, known genetic mutations, or radiation treatment to the chest before age 43. Patient does not have history of cervical dysplasia,  immunocompromised, or DES exposure in-utero.  Risk Assessment    Risk Scores      09/30/2020 09/23/2019   Last edited by: Meryl Dare, CMA McGill, Sherie Demetrius Charity, LPN   5-year risk: 0.5 % 0.5 %   Lifetime risk: 4 % 4 %         A: [facility] exam without pap smear No complaints.  P: Referred patient to the [facility] for a screening mammogram on the mobile unit. Appointment scheduled Thursday, September 30, 2020 at 1130.  Priscille Heidelberg, RN 09/30/2020 10:44 AM

## 2020-10-06 ENCOUNTER — Inpatient Hospital Stay: Payer: No Typology Code available for payment source | Attending: Obstetrics and Gynecology | Admitting: *Deleted

## 2020-10-06 ENCOUNTER — Other Ambulatory Visit: Payer: Self-pay

## 2020-10-06 VITALS — BP 112/78 | Ht 60.0 in | Wt 148.3 lb

## 2020-10-06 DIAGNOSIS — Z0000 Encounter for general adult medical examination without abnormal findings: Secondary | ICD-10-CM

## 2020-10-06 NOTE — Progress Notes (Signed)
Wisewoman initial screening   Interpreter- [name], [place]   Clinical Measurement:  Vitals:   10/06/20 0830 10/06/20 0901  BP: 118/78 112/78   Fasting Labs Drawn Today, will review with patient when they result.   Medical History:  Patient states that she has high cholesterol, does not have high blood pressure and she does not have diabetes.  Medications:  Patient states that she does not take medication to lower cholesterol, blood pressure or blood sugar.  Patient does not take an aspirin a day to help prevent a heart attack or stroke.    Blood pressure, self measurement: Patient states that she does not measure blood pressure from home. She checks her blood pressure N/A. She shares her readings with a health care provider: N/A.   Nutrition: Patient states that on average she eats 2 cups of fruit and 1 cups of vegetables per day. Patient states that she does not eat fish at least 2 times per week. Patient eats less than half servings of whole grains. Patient drinks less than 36 ounces of beverages with added sugar weekly: yes. Patient is currently watching sodium or salt intake: yes. In the past 7 days patient has consumed drinks containing alcohol on 0 days. On a day that patient consumes drinks containing alcohol on average 0 drinks are consumed.      Physical activity:  Patient states that she gets 50 minutes of moderate and 50 minutes of vigorous physical activity each week.  Smoking status:  Patient states that she has has never smoked .   Quality of life:  Over the past 2 weeks patient states that she had little interest or pleasure in doing things: several days. She has been feeling down, depressed or hopeless:several days.    Risk reduction and counseling:  Health Coaching: Spoke with patient about the daily recommendation for vegetables. Showed patient what 1 serving would look like. Spoke with patient about heart healthy fish. 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 Encouraged patient to try and get 1-2 servings per week. Patient currently does not eat a lot of whole grains. She states that she does like oatmeal. Gave suggestions for other whole grains like whole wheat bread or pasta, whole grain cereals or brown rice. Patient does exercise at least 10-15 minutes per day. Encouraged her to continue with daily exercise with a goal of 20 minutes per day.    Navigation:  I will notify patient of lab results.  Patient is aware of 2 more health coaching sessions and a follow up. Gave patient resource list for counseling services offered here in [place].   Time: 20 minutes

## 2020-10-07 LAB — HEMOGLOBIN A1C
Est. average glucose Bld gHb Est-mCnc: 114 mg/dL
Hgb A1c MFr Bld: 5.6 % (ref 4.8–5.6)

## 2020-10-07 LAB — LIPID PANEL
Chol/HDL Ratio: 5.5 ratio — ABNORMAL HIGH (ref 0.0–4.4)
Cholesterol, Total: 204 mg/dL — ABNORMAL HIGH (ref 100–199)
HDL: 37 mg/dL — ABNORMAL LOW (ref >39)
LDL Chol Calc (NIH): 147 mg/dL — ABNORMAL HIGH (ref 0–99)
Triglycerides: 109 mg/dL (ref 0–149)
VLDL Cholesterol Cal: 20 mg/dL (ref 5–40)

## 2020-10-07 LAB — GLUCOSE, RANDOM: Glucose: 107 mg/dL — ABNORMAL HIGH (ref 65–99)

## 2020-10-11 ENCOUNTER — Telehealth: Payer: Self-pay

## 2020-10-11 NOTE — Telephone Encounter (Signed)
Health coaching 2   interpreter- [employer] [phone]   Labs- 204 cholesterol, 147 LDL cholesterol, 109 triglycerides, 37 HDL cholesterol, 5.6 hemoglobin A1C, 107 mean plasma glucose. Patient understands and is aware of her lab results.   Goals-  1. Reduce the amount of fried and fatty foods consumed. 2. Reduce the amount of red meat consumed. Substitute for lean protein like chicken, fish or [place]. 3. Increase the amount of whole grains consumed. 4. Reduce the amount of sweets and sugars consumed in both food and drinks. 5. Reduce the amount of carbs consumed.    Navigation:  Patient is aware of 1 more health coaching sessions and a follow up. Patient is scheduled for follow-up appointment on Wednesday, October 20, 2020 @ 1:15 pm with Internal Medicine for elevated labs. Offered to refer patient to DPP program will follow-up with patient to see if she would like to be enrolled.  Time- 11 minutes

## 2020-10-20 ENCOUNTER — Encounter: Payer: No Typology Code available for payment source | Admitting: Student

## 2020-10-27 ENCOUNTER — Ambulatory Visit (INDEPENDENT_AMBULATORY_CARE_PROVIDER_SITE_OTHER): Payer: Self-pay | Admitting: Student

## 2020-10-27 ENCOUNTER — Encounter: Payer: Self-pay | Admitting: Student

## 2020-10-27 ENCOUNTER — Other Ambulatory Visit: Payer: Self-pay

## 2020-10-27 DIAGNOSIS — E7800 Pure hypercholesterolemia, unspecified: Secondary | ICD-10-CM

## 2020-10-27 DIAGNOSIS — M25571 Pain in right ankle and joints of right foot: Secondary | ICD-10-CM

## 2020-10-27 DIAGNOSIS — M25572 Pain in left ankle and joints of left foot: Secondary | ICD-10-CM

## 2020-10-27 NOTE — Patient Instructions (Addendum)
Fue un Photographer. Hoy discutimos:  Contine con el ejercicio y una dieta saludable para mejorar sus niveles de colesterol. Necesita algn medicamento en este momento.  Para el dolor en los pies y [place], esto puede estar relacionado con estar de pie y trabajar en la cocina. Puede probar Tylenol e ibuprofeno segn sea necesario para el dolor. Tambin asegrese de usar zapatos cmodos para el trabajo. Si el dolor empeora, o desarrolla hinchazn, enrojecimiento o calor, venga a la clnica y podemos volver a examinarlo.  Si tiene alguna pregunta o inquietud, llame a [place] clnica al [phone] [place] 9 a. m. y las 5 p. m. y despus del horario de atencin llame al [phone] y pregunte por el residente de [place] interna de [place]. Si cree que tiene Engineer, drilling, llame al 911.   If you have not gotten the COVID vaccine, I recommend doing so:  You may get it at your local [facility] or [facility] OR To schedule an appointment for a COVID vaccine or be added to the vaccine wait list: Go to [email or web address]   OR Go to [email or web address]                  OR Call [phone]                                     OR Call [phone] and select Option 2   Dieta mediterrnea Mediterranean Diet El trmino "dieta mediterrnea" hace referencia a elecciones de alimentacin y de estilo de vida basadas en las tradiciones de los pases ubicados en las costas del mar Mediterrneo. Se ha demostrado que esta dieta ayuda a prevenir determinadas afecciones y a [employer]oco[employer]ips que padecen enfermedades crnicas, como enfermedades renales y cardacas. Consejos para seguir Advertising account executive de vida  Cocine y coma en familia, cuando sea posible.  Beba suficiente lquido como para mantener la orina clara o de color amarillo plido.  Haga actividad fsica [employer]. Esto incluye lo siguiente: ? Ejercicios aerbicos, como nadar o  correr. ? Actividades recreativas, como jardinera, caminatas o tareas domsticas.  Intente dormir de 7 a 8horas todas las noches.  Si el mdico se lo recomienda, consuma vino tinto con moderacin. Esto significa 1vaso por da para mujeres que no estn embarazadas y 2vasos por da para los hombres. Un vaso de vino equivale a 5oz ( ). Leer las etiquetas de los alimentos  Verificar el tamao de la porcin de los alimentos envasados. En el caso del arroz y la pasta, el tamao de la porcin se refiere a la cantidad de alimento cocido, no seco.  Administrator de las grasas totales de los alimentos envasados. Evite los que contienen grasas saturadas o trans.  Lea la lista de ingredientes para determinar si los alimentos contienen azcares agregados, como jarabe de maz.   De compras  En el supermercado, compre la [employer]son de los alimentos en las reas cercanas a las paredes del edificio. Esto incluye lo siguiente: ? Nils Pyle y verduras frescas. ? Cereales, frijoles, frutos secos y semillas. Algunos de estos productos pueden estar disponibles sueltos o en grandes cantidades (a granel). ? Estate agent. ? Aves y [place]. ? Productos lcteos descremados.  Compre ingredientes integrales en lugar de alimentos preenvasados.  Compre frutas y verduras de estacin frescas en mercados de granjeros locales.  Compre frutas y verduras congeladas en bolsas  hermticas.  Si no tiene acceso a Estate agent de buena calidad, compre camarones congelados precocidos o pescado en lata, como atn, salmn o sardinas.  Compre pequeas cantidades de verduras crudas o cocidas, ensaladas o aceitunas en la seccin de ensaladas o de charcutera de la tienda.  Surta su despensa para tener siempre determinados productos a mano, por ejemplo, aceite de oliva, atn en lata, tomates en lata, arroz, pasta y frijoles. Coccin  Cocine con aceite de oliva extra virgen en lugar de usar mantequilla u otros  aceites vegetales.  Use las carnes como guarnicin y las verduras o los cereales como plato principal. Esto significa consumir porciones pequeas de carne o agregarla a la pasta o a los guisos en pequeas cantidades.  Use frijoles o verduras en lugar de carne en platos comunes como chili o lasaa.  Experimente con diferentes mtodos de coccin. Intente asar las verduras en lugar de cocerlas al vapor o saltearlas.  Agregue verduras congeladas a sopas, guisos, pasta o arroz.  Agregue frutos secos o semillas para cubrir la porcin de grasas saludables en cada comida. Puede agregarlos a yogures, ensaladas o platos con verduras.  Marine el pescado o las verduras con aceite de E. Lopez, [place] de [place], ajo y Science writer. Planificacin de las comidas  Planifique 1comida vegetariana por da todas las semanas. Si es posible, intente agregar hasta 2.  Coma mariscos 2o ms veces por semana.  Tenga a mano bocadillos saludables, por ejemplo: ? Bastones de verduras con humus. ? Yogur griego. ? Mezcla de frutos secos y frutas.  Consuma comidas equilibradas toda la semana. Esto incluye lo siguiente: ? Frutas: 2 a 3porciones por da ? Verduras: 4 a 5porciones por da ? Lcteos con bajo contenido de grasa: 2porciones por da ? Pescado, aves o carne magra: 1porcin por da ? Frijoles y legumbres: 2porciones o ms por semana ? Frutos secos y semillas: 1 a 2porciones por da ? Cereales integrales: 6 a 8porciones por da ? Aceite de oliva extra virgen: 3 a 4porciones por da  Limite las carnes rojas y los dulces a solo unas porciones al mes.   Qu alimentos elijo?  Dieta mediterrnea ? Recomendados  Cereales: Pastas integrales. Arroz integral. Sharion Dove burgol. Polenta. Cuscs. Pan integral. Avena. Quinua.  Verduras: Alcachofas. Remolachas. Brcoli. Repollo. Zanahorias. Christella Noa. Judas verdes. Acelga. Col rizada. Espinaca. Cebollas. Puerro. Guisantes. Calabaza. Tomates. Pimientos.  Rbanos.  Nils PyleArlan Organ. Damascos. Aguacate. Frutos rojos. Bananas. Cerezas. Dtiles. Higos. Uvas. Limones. Meln. Naranjas. Duraznos. Ciruelas. Granada.  Carnes y otros alimentos ricos en protenas: Frijoles. Almendras. Semillas de girasol. Piones. Manes. Bacalao. Salmn. Vieiras. Camarones. Atn. Tilapia. Almejas. Ostras. Huevos.  Lcteos: Leche con bajo contenido de Armorel. Queso. Yogur griego.  BebidasWestley Hummer. Vino tinto. T de hierbas.  Grasas y aceites: Aceite de oliva extra virgen. Aceite de aguacate. Aceite de pepitas de uva.  Dulces y postres: Yogur griego con miel. Manzanas asadas. Peras asadas. Mezcla de frutos secos.  Condimentos y otros alimentos: Albahaca. Cilantro. Coriandro. Comino. Menta. Perejil. Salvia. Lonna Cobb. Estragn. Ajo. Organo. Tomillo. Pimienta. Aceto balsmico. Tahini. Hummus. Salsa de tomate. Aceitunas. Hongos. ? Limite  Cereales: Comidas con arroz o pasta preenvasada. Cereales preenvasados con azcar agregada.  Verduras: Patatas fritas.  Frutas: Frutas enlatadas con almbar.  Carnes y otros alimentos ricos en protenas: Carne de vaca. Cerdo. Cordero. Carne de ave con piel. Perros calientes. Tocino.  Lcteos: Helados. PPG Industries. Leche entera.  Bebidas: Jugos. Refrescos endulzados con azcar. Cerveza. Licores y bebidas espirituosas.  Grasas y aceites: Mantequilla.  Aceite de canola. Aceite vegetal. Grasa de carne de res. Manteca de cerdo.  Dulces y postres: Gaffer. Bizcochuelos. Pasteles. Caramelos.  Condimentos y otros alimentos: Mayonesa. Salsas y adobos listos para consumir. Esta podra no ser [employer]on. Hable con el nutricionista sobre las opciones de alimentos ms adecuadas para usted. Resumen  La dieta mediterrnea implica elecciones de alimentos y de estilo de vida.  Consuma una variedad de frutas y verduras frescas, frijoles, frutos secos, semillas y cereales integrales.  Limite la cantidad de carnes rojas y  dulces.  Pregntele al mdico si puede beber vino tinto con moderacin. Esto significa 1vaso por da para mujeres que no estn embarazadas y 2vasos por da para los hombres. Un vaso de vino equivale a 5oz ( ). Esta informacin no tiene Theme park manager el consejo del mdico. Asegrese de hacerle al mdico cualquier pregunta que tenga. Document Revised: 02/05/2017 Elsevier Patient Education  [employer].

## 2020-10-29 DIAGNOSIS — M2550 Pain in unspecified joint: Secondary | ICD-10-CM | POA: Insufficient documentation

## 2020-10-29 NOTE — Progress Notes (Signed)
   CC: elevated lipid panel  HPI:  [name] is a 55 y.o. female with history below presents for evaluation of elevated lipid levels from recent wise woman visit. Please refer to problem based charting for further details and assessment and plan of current problem and chronic medical conditions.   Past Medical History:  Diagnosis Date  . High cholesterol   . Hypertension   . Migraines    Review of Systems:  Negative as per HPI  Physical Exam:  Vitals:   10/27/20 1518  BP: 117/67  Pulse: 77  Temp: 98.3 F (36.8 C)  TempSrc: Oral  SpO2: 98%  Weight: 140 lb 13.6 oz (63.9 kg)  Height: 5\' 2"  (1.575 m)   Constitutional: Appears well-developed and well-nourished. No distress.  HENT: Normocephalic and atraumatic, EOMI, conjunctiva normal, moist mucous membranes Cardiovascular: Normal rate, regular rhythm, S1 and S2 present, no murmurs, rubs, gallops.  Distal pulses intact Respiratory: No respiratory distress, no accessory muscle use.  Effort is normal.  Lungs are clear to auscultation bilaterally. GI: Nondistended, soft, nontender to palpation, normal active bowel sounds Musculoskeletal: No joint swelling redness or warmth of MCPs or bilateral ankles, No peripheral edema noted. Neurological: Is alert and oriented x4, no apparent focal deficits noted. Skin: Warm and dry.  No rash, erythema, lesions noted. Psychiatric: Normal mood and affect. Behavior is normal. Judgment and thought content normal.    Assessment & Plan:   See Encounters Tab for problem based charting.  Patient discussed with Dr. 

## 2020-10-29 NOTE — Assessment & Plan Note (Signed)
Patient presents for elevated cholesterol levels on recent labs.  Lipid Panel     Component Value Date/Time   CHOL 204 (H) 10/06/2020 0910   TRIG 109 10/06/2020 0910   HDL 37 (L) 10/06/2020 0910   CHOLHDL 5.5 (H) 10/06/2020 0910   CHOLHDL 5.1 04/16/2014 0925   VLDL 27 04/16/2014 0925   LDLCALC 147 (H) 10/06/2020 0910   LABVLDL 20 10/06/2020 0910   ASCVD risk score of 2%. No indication for statin therapy. Discussed lifestyle modification including increasing exercise, reducing saturated fats, increasing fruits, vegetables, and whole grains. States she has been trying to walk more in the morning. Provided with Mediterranean  eating plan with AVS.

## 2020-10-29 NOTE — Assessment & Plan Note (Signed)
Patient notes increase ankle pain and swelling after working in the past few weeks. Notes this is worse after eating seafood. Also states MCPs of bilateral hand also feel warm, stiff, and achy in the morning and after working. Notes pain lasts a few hours and improves with rest. In office today patient states she is well an not currently having joint pain. 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 On exam no signs of synovitis on exam, normal strength in hands and LEs. Recent ESR and CRP wnl and reassuring. Advised she wear comfortable and supportive shoes during work and take tylenol or ibuprofen as needed for arthralgias. Given lack of symptoms today will hold of on further testing.  Advised she follow up if she noticed increase swelling, heat, redness, and pain in her joints as it may required further work up.

## 2020-11-01 NOTE — Progress Notes (Signed)
Internal Medicine Clinic Attending ? ?Case discussed with Dr. Liang  At the time of the visit.  We reviewed the resident?s history and exam and pertinent patient test results.  I agree with the assessment, diagnosis, and plan of care documented in the resident?s note. ? ?

## 2020-11-24 ENCOUNTER — Other Ambulatory Visit: Payer: Self-pay

## 2020-11-24 ENCOUNTER — Ambulatory Visit: Payer: Self-pay | Admitting: Physician Assistant

## 2020-11-24 VITALS — BP 143/74 | HR 70 | Temp 98.2°F | Resp 18 | Ht 60.0 in | Wt 150.0 lb

## 2020-11-24 DIAGNOSIS — M79672 Pain in left foot: Secondary | ICD-10-CM

## 2020-11-24 DIAGNOSIS — M79671 Pain in right foot: Secondary | ICD-10-CM

## 2020-11-24 DIAGNOSIS — R030 Elevated blood-pressure reading, without diagnosis of hypertension: Secondary | ICD-10-CM

## 2020-11-24 MED ORDER — MELOXICAM 7.5 MG PO TABS: 7.5000 mg | ORAL_TABLET | Freq: Every day | ORAL | 0 refills | Status: DC

## 2020-11-24 NOTE — Patient Instructions (Addendum)
Keep your feet elevated when you are able, wear good supportive shoes, make sure you are drinking plenty of water and follow a low-sodium diet.  I sent a pain medication to the pharmacy to help you with the pain in your feet.  Please check your blood pressure at home and keep a written log, please bring that with you to the mobile medicine in 2 weeks for further review  Roney Jaffe, PA-C Physician Assistant [facility] Mobile Medicine [email or web address]   Cmo tomarse la presin arterial How to Take Your Blood Pressure La presin arterial es la medida de la fuerza de la sangre al presionar contra las paredes de las arterias. Las arterias son los vasos sanguneos que transportan la sangre desde el corazn hacia todas las partes del cuerpo. Su mdico toma su presin arterial en cada visita al consultorio. Usted tambin puede tomarse la presin arterial en casa con un tensimetro. Es posible que deba tomarse la presin arterial:  Para confirmar un diagnstico de presin arterial elevada (hipertensin).  Para vigilar su presin arterial a lo largo del tiempo.  Para asegurarse de que el medicamento que toma para la presin arterial est surtiendo Engineer, mining. Materiales necesarios:  Monitor para medir la presin arterial.  Silla de comedor para sentarse.  Mesa o escritorio.  Cuaderno pequeo y lpiz o bolgrafo. Cmo prepararse Para obtener la lectura ms precisa, evite realizar lo siguiente durante los 30 minutos previos a Chief Operating Officer su presin arterial:  Beber cafena.  Consumir alcohol.  Comer.  Fumar.  Realizar actividad fsica. Cinco minutos antes de controlar su presin arterial:  Vaya al bao y orine para tener la vejiga vaca.  Sintese tranquilamente en una silla de comedor. No se siente en un silln blando o sof. No hable. Cmo tomarse la presin arterial Para controlar su presin arterial, siga las instrucciones presentes en  el manual que se incluye con el tensimetro. Si tiene Ambulance person, las instrucciones podran ser las siguientes: 1. Sintese derecho en una silla. 2. Coloque los pies en el piso. No cruce los tobillos ni las piernas. 3. Apoye su brazo izquierdo al nivel de su corazn en una mesa o escritorio, o en el brazo de la silla. 4. Arremnguese. 5. Envuelva la parte superior de su brazo izquierdo, 1 pulgada (2,5 cm) sobre su codo, con el brazalete. Es mejor Optometrist brazalete alrededor de la piel [place]. 6. Ajuste el brazalete alrededor de su brazo. Debe poder meter nicamente un dedo entre el brazalete y Cabin crew. 7. Coloque el cordn de modo que quede apoyado en el pliegue del codo. 8. Presione el botn de encendido. 9. Permanezca sentado tranquilamente mientras el brazalete se infla y se desinfla. 10. Lea la lectura digital que aparece en la pantalla del tensimetro y anote los nmeros (regstrelos) en un cuaderno. 11. Espere de 2a3 minutos, y luego repita los pasos desde el paso 1.   Qu significa mi lectura de presin arterial? Una lectura de la presin arterial consta de un nmero ms alto sobre un nmero ms bajo. En condiciones ideales, la presin arterial debe estar por debajo de 120/80. El primer nmero ("superior") es la presin sistlica. Es la medida de la presin de las arterias cuando el corazn late. El segundo nmero ("inferior") es la presin diastlica. Es la medida de la presin en las arterias cuando el corazn se relaja. La presin arterial se clasifica en cinco etapas. Las siguientes son las etapas para adultos que no tienen enfermedad grave de Education officer, community  plazo o una afeccin crnica. La presin sistlica y la presin diastlica se miden en una unidad llamada mm Hg (milmetros de mercurio).  Normal  Presin sistlica: por debajo de 120.  Presin diastlica: por debajo de 80. Elevada  Presin sistlica: 120-129.  Presin diastlica: por debajo de 80. Etapa 1 de  hipertensin  Presin sistlica: 130-139.  Presin diastlica: 80-89. Etapa 2 de hipertensin  Presin sistlica: 140 o ms.  Presin diastlica: 90 o ms. Puede tener presin arterial elevada o hipertensin incluso si nicamente el nmero sistlico o el diastlico de su lectura es ms elevado de lo normal. Siga estas instrucciones en su casa:  Mida su presin arterial con la frecuencia recomendada por su mdico.  Contrlese la presin arterial a la [employer].  Lleve el tensimetro a su prxima cita con el mdico para asegurarse de lo siguiente: ? Que lo [place] correctamente. ? Que genera lecturas precisas.  Asegrese de entender cules son sus objetivos para la presin arterial.  Dgale a su mdico si tiene efectos secundarios causados por los medicamentos para la presin arterial.  Concurra a todas las visitas de seguimiento como se lo haya indicado el mdico. Esto es importante. Consejos generales  Su mdico puede sugerirle un tensimetro confiable que cumpla con sus necesidades. Existen varios tipos de tensimetros para Cabin crew.  Escoja un tensimetro que tenga un brazalete. No escoja un tensimetro que mida su presin arterial en la mueca o el dedo.  Escoja un brazalete que envuelva ceidamente la parte superior de su brazo. Debe poder meter nicamente un dedo entre el brazalete y Cabin crew.  Puede comprar un tensimetro en la mayora de las farmacias o en lnea. Dnde buscar ms informacin American Heart Association (Asociacin Estadounidense del Corazn): www.heart.org Comunquese con un mdico si:  Su presin arterial es sistemticamente alta. Solicite ayuda de inmediato si:  Su presin arterial sistlica est por encima de 180.  Su presin arterial diastlica est por encima de 120. Resumen  La presin arterial es la medida de la fuerza de la sangre al presionar contra las paredes de las arterias.  Una lectura de la presin arterial  consta de un nmero ms alto sobre un nmero ms bajo. En condiciones ideales, la presin arterial debe estar por debajo de 120/80.  Contrlese la presin arterial a la [employer].  Evite la cafena, el alcohol, fumar y hacer actividad fsica durante los 30 minutos anteriores a controlarse la presin arterial. Estos factores pueden afectar la precisin de la lectura de la presin arterial. Esta informacin no tiene Theme park manager el consejo del mdico. Asegrese de hacerle al mdico cualquier pregunta que tenga. Document Revised: 07/28/2019 Document Reviewed: 07/28/2019 Elsevier Patient Education  2021 [employer].

## 2020-11-24 NOTE — Progress Notes (Signed)
New Patient Office Visit  Subjective:  Patient ID: Heidi Rogers, female    DOB: 1965-12-01  Age: 55 y.o. MRN: [id number]  CC:  Chief Complaint  Patient presents with  . Foot Pain    bilateral    HPI Heidi Rogers states that she has been having swelling in both ankles for the past week, states that they are painful now, does consider them to be swollen right now.  States that she works in [employer], states that she has been unable to work due to the pain in her feet.  Has tried advil with a little relief.  Does not check BP at home, has not had any elevated readings at medical visits. States that she does not eat much salt  States that she drinks about 3 bottles of water a day   Due to language barrier, an interpreter was present during the history-taking and subsequent discussion (and for part of the physical exam) with this patient.   Past Medical History:  Diagnosis Date  . High cholesterol   . Hypertension   . Migraines     Past Surgical History:  Procedure Laterality Date  . TUBAL LIGATION      Family History  Problem Relation Age of Onset  . Migraines Neg Hx     Social History   Socioeconomic History  . Marital status: Single    Spouse name: Not on file  . Number of children: 5  . Years of education: Not on file  . Highest education level: 2nd grade  Occupational History  . Not on file  Tobacco Use  . Smoking status: Never Smoker  . Smokeless tobacco: Never Used  Vaping Use  . Vaping Use: Never used  Substance and Sexual Activity  . Alcohol use: No  . Drug use: Not Currently  . Sexual activity: Not Currently    Birth control/protection: Surgical, Post-menopausal  Other Topics Concern  . Not on file  Social History Narrative   Lives with her daughter   Right handed   Caffeine: 2 cups coffee/day   Social Determinants of Health   Financial Resource Strain: Not on file  Food Insecurity: No Food Insecurity  . Worried About Brewing technologist in the Last Year: Never true  . Ran Out of Food in the Last Year: Never true  Transportation Needs: No Transportation Needs  . Lack of Transportation (Medical): No  . Lack of Transportation (Non-Medical): No  Physical Activity: Not on file  Stress: Not on file  Social Connections: Not on file  Intimate Partner Violence: Not on file    ROS Review of Systems  Constitutional: Negative for chills and fever.  HENT: Negative.   Eyes: Negative.   Respiratory: Negative for shortness of breath.   Cardiovascular: Negative for chest pain and palpitations.  Gastrointestinal: Negative.   Endocrine: Negative.   Genitourinary: Negative.   Musculoskeletal: Positive for gait problem.  Skin: Negative.   Allergic/Immunologic: Negative.   Hematological: Negative.     Objective:   Today's Vitals: BP (!) 143/74 (BP Location: Left Arm, Patient Position: Sitting, Cuff Size: Normal)   Pulse 70   Temp 98.2 F (36.8 C) (Oral)   Resp 18   Ht 5' (1.524 m)   Wt 150 lb (68 kg)   LMP 02/01/2018   SpO2 99%   BMI 29.29 kg/m   Physical Exam Vitals and nursing note reviewed.  Constitutional:      Appearance: Normal appearance.  HENT:  Head: Normocephalic and atraumatic.     Right Ear: External ear normal.     Left Ear: External ear normal.     Nose: Nose normal.     Mouth/Throat:     Mouth: Mucous membranes are moist.     Pharynx: Oropharynx is clear.  Eyes:     Extraocular Movements: Extraocular movements intact.     Conjunctiva/sclera: Conjunctivae normal.     Pupils: Pupils are equal, round, and reactive to light.  Cardiovascular:     Rate and Rhythm: Normal rate and regular rhythm.     Pulses: Normal pulses.          Dorsalis pedis pulses are 2+ on the right side and 2+ on the left side.       Posterior tibial pulses are 2+ on the right side and 2+ on the left side.     Heart sounds: Normal heart sounds.  Pulmonary:     Effort: Pulmonary effort is normal.     Breath sounds:  Normal breath sounds.  Musculoskeletal:        General: Normal range of motion.     Cervical back: Normal range of motion and neck supple.     Right lower leg: No edema.     Left lower leg: No edema.  Skin:    General: Skin is warm and dry.  Neurological:     General: No focal deficit present.     Mental Status: She is alert and oriented to person, place, and time.  Psychiatric:        Thought Content: Thought content normal.        Judgment: Judgment normal.     Assessment & Plan:   Problem List Items Addressed This Visit      Other   Foot pain, bilateral - Primary   Relevant Medications   meloxicam (MOBIC) 7.5 MG tablet   Elevated blood pressure reading in office without diagnosis of hypertension      Outpatient Encounter Medications as of 11/24/2020  Medication Sig  . acetaminophen (TYLENOL 8 HOUR) 650 MG CR tablet Take 1 tablet (650 mg total) by mouth every 8 (eight) hours as needed for pain.  Marland Kitchen aspirin-acetaminophen-caffeine (EXCEDRIN MIGRAINE) 250-250-65 MG tablet Take 1 tablet by mouth every 6 (six) hours as needed for headache.  . meloxicam (MOBIC) 7.5 MG tablet Take 1 tablet (7.5 mg total) by mouth daily.   No facility-administered encounter medications on file as of 11/24/2020.  1. Foot pain, bilateral No edema is evident on physical exam.  Trial meloxicam, patient education given on rest, elevating feet, low-sodium diet, proper fitting shoes.  Red flags given for prompt reevaluation. - meloxicam (MOBIC) 7.5 MG tablet; Take 1 tablet (7.5 mg total) by mouth daily.  Dispense: 30 tablet; Refill: 0  2. Elevated blood pressure reading in office without diagnosis of hypertension Patient encouraged to check blood pressure at home, keep a written log and have available for all office visits.  Patient encouraged to return to mobile unit in 2 weeks for follow-up.  Patient understands and agrees.    I have reviewed the patient's medical history (PMH, PSH, Social History,  Family History, Medications, and allergies) , and have been updated if relevant. I spent 32 minutes reviewing chart and  face to face time with patient.    Follow-up: Return in about 2 weeks (around 12/08/2020).   Heidi Knudsen Mayers, PA-C

## 2020-11-24 NOTE — Progress Notes (Signed)
Patient reports pain in the feet for for the past month with swelling in the ankle. The pain increases throughout the day. Patient does report a slight HA today on the right side. Patient has not eaten today and patient has not taken medication today.

## 2020-11-25 DIAGNOSIS — M79671 Pain in right foot: Secondary | ICD-10-CM | POA: Insufficient documentation

## 2020-11-25 DIAGNOSIS — M79672 Pain in left foot: Secondary | ICD-10-CM | POA: Insufficient documentation

## 2020-11-25 DIAGNOSIS — R030 Elevated blood-pressure reading, without diagnosis of hypertension: Secondary | ICD-10-CM | POA: Insufficient documentation

## 2020-11-29 ENCOUNTER — Encounter: Payer: Self-pay | Admitting: *Deleted

## 2020-12-15 ENCOUNTER — Telehealth: Payer: Self-pay | Admitting: *Deleted

## 2020-12-15 NOTE — Telephone Encounter (Addendum)
I called the patient using [address] interpreters, Spanish interpreter [place].  The patient agreed to repeat MRI brain to further evaluate orbits.  Patient is aware the order will be placed in [facility] imaging will call her to schedule.  She stated she has been approved for financial assistance through September.  She does still get headaches although they are not daily.  She takes Tylenol.  She states she saw Dr. Dione Booze.  Patient is eager to know what is wrong with her eye.   ---------------------- We mentioned repeating her MRIs in 3-4 monts at last appointment. Would you call and see if she is ok with that and I can reorder? Thanks  ----- Message ----- From: Anson Fret, MD  MRI of the brain is unremarkable but we will need to repeat it in 3-6 months due to enhancement on mri orbits,

## 2020-12-21 ENCOUNTER — Other Ambulatory Visit: Payer: Self-pay | Admitting: Neurology

## 2020-12-21 ENCOUNTER — Telehealth: Payer: Self-pay | Admitting: Neurology

## 2020-12-21 DIAGNOSIS — H5461 Unqualified visual loss, right eye, normal vision left eye: Secondary | ICD-10-CM

## 2020-12-21 DIAGNOSIS — H469 Unspecified optic neuritis: Secondary | ICD-10-CM

## 2020-12-21 DIAGNOSIS — R519 Headache, unspecified: Secondary | ICD-10-CM

## 2020-12-21 NOTE — Telephone Encounter (Signed)
MRI brain w/wo & orbits w/wo sent to GI for scheduling (self pay pt).

## 2020-12-31 ENCOUNTER — Ambulatory Visit
Admission: RE | Admit: 2020-12-31 | Discharge: 2020-12-31 | Disposition: A | Discharge status: Home / Self Care | Payer: No Typology Code available for payment source | Source: Ambulatory Visit | Attending: Neurology | Admitting: Neurology

## 2020-12-31 DIAGNOSIS — H5461 Unqualified visual loss, right eye, normal vision left eye: Secondary | ICD-10-CM

## 2020-12-31 DIAGNOSIS — R519 Headache, unspecified: Secondary | ICD-10-CM

## 2020-12-31 DIAGNOSIS — H469 Unspecified optic neuritis: Secondary | ICD-10-CM

## 2020-12-31 MED ORDER — GADOBENATE DIMEGLUMINE 529 MG/ML IV SOLN
13.0000 mL | Freq: Once | INTRAVENOUS | Status: AC | PRN
  Administered 2020-12-31: 13 mL via INTRAVENOUS

## 2021-01-06 ENCOUNTER — Ambulatory Visit: Payer: Self-pay | Attending: Critical Care Medicine | Admitting: Critical Care Medicine

## 2021-01-06 ENCOUNTER — Telehealth: Payer: Self-pay

## 2021-01-06 ENCOUNTER — Other Ambulatory Visit: Payer: Self-pay

## 2021-01-06 ENCOUNTER — Telehealth: Payer: Self-pay | Admitting: Neurology

## 2021-01-06 ENCOUNTER — Encounter: Payer: Self-pay | Admitting: Critical Care Medicine

## 2021-01-06 VITALS — BP 143/81 | HR 68 | Temp 98.2°F | Resp 16 | Ht 62.01 in | Wt 148.2 lb

## 2021-01-06 DIAGNOSIS — H538 Other visual disturbances: Secondary | ICD-10-CM

## 2021-01-06 DIAGNOSIS — I10 Essential (primary) hypertension: Secondary | ICD-10-CM

## 2021-01-06 DIAGNOSIS — M79671 Pain in right foot: Secondary | ICD-10-CM

## 2021-01-06 DIAGNOSIS — E7800 Pure hypercholesterolemia, unspecified: Secondary | ICD-10-CM

## 2021-01-06 DIAGNOSIS — H469 Unspecified optic neuritis: Secondary | ICD-10-CM

## 2021-01-06 DIAGNOSIS — G43809 Other migraine, not intractable, without status migrainosus: Secondary | ICD-10-CM

## 2021-01-06 DIAGNOSIS — M79672 Pain in left foot: Secondary | ICD-10-CM

## 2021-01-06 MED ORDER — CHLORTHALIDONE 25 MG PO TABS
25.0000 mg | ORAL_TABLET | Freq: Every day | ORAL | 1 refills | Status: DC
  Filled 2021-01-06: qty 30, 30d supply, fill #0
  Filled 2021-02-21: qty 30, 30d supply, fill #1
  Filled 2021-04-06: qty 30, 30d supply, fill #2

## 2021-01-06 MED ORDER — MELOXICAM 7.5 MG PO TABS
7.5000 mg | ORAL_TABLET | Freq: Every day | ORAL | 1 refills | Status: DC
  Filled 2021-01-06: qty 30, 30d supply, fill #0

## 2021-01-06 NOTE — Assessment & Plan Note (Signed)
This patient currently has hypertension and will need treatment.  I went over with her a healthy diet in Spanish and gave a diet to her in [place].  I Heidi Rogers begin chlorthalidone 25 mg daily.  Recent renal function was checked was normal and she does not have diabetes

## 2021-01-06 NOTE — Telephone Encounter (Signed)
Dr. Shan Levans PCP from [facility] is patients new PCP, requesting a call back in regards to pt's MRI results. 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 Please advise.

## 2021-01-06 NOTE — Assessment & Plan Note (Signed)
We will refill meloxicam and refer to podiatry  Patient does have the orange card of asked her to make sure she reapplies before it runs out in September

## 2021-01-06 NOTE — Telephone Encounter (Signed)
[facility] patient.  She is still complaining of vision loss in the right eye.  We discussed that patient has been advised multiple times to follow-up with Dr. Annette Stable) who I believe was also was going to refer her to [facility] neuro-ophthalmology.  We followed up with patient this past March and we encouraged her to go back and see Dr. Dione Booze, Dr. Delford Field is going to follow-up on that and he is also going to send another referral to [facility] neuro[facility]ophthalmology at [facility].  I discussed with Dr. Delford Field that I did review her recent repeat MRI of the orbits yesterday with one of our neuroradiologists here in the office (a different neuroradiologist than the physician who actually read the film) and we did not see any abnormalities or any enhancement of the optic nerve.  Recent repeat MRI of the brain showed mild T2 hyperintensities likely migraine related or mild chronic microvascular ischemic changes not disproportionate to patient's age and medical conditions. I reviewed the most recent MRI of the brain images and I am not concerned that these white matter changes represent anything more concerning such as multiple sclerosis or vasculitis; I also reviewed prior MRI of the brain and these T2 hyperintensities appear stable, but since the neuroradiologist did not make any comments on that in the report I will ask another neuroradiologist possibly Dr. Epimenio Foot to kindly verify that MRI of the brain white matter changes are nonspecific and likely chronic ischemic microvascular changes and/or related to migraines and not demyelination for example.  In addition we will order visual evoked potentials.

## 2021-01-06 NOTE — Assessment & Plan Note (Signed)
I put a call into Dr. Daisy Blossom we had a long discussion she tells me Dr. Alden Hipp has been handling her retinal issues and was stated to have a normal retinal exam in the right eye.  I do not have his records.  Patient was to be referred to neuro-ophthalmology but this is yet to occur.  There is considerable language barrier and this information from the patient as I think she is not fully understanding I think we got through to her today with a good interpreter about her current status.  Her current optic MRI is normal there was some question of an abnormality in February but this has been clarified and I spoke personally on the phone with Dr. Daisy Blossom today and she states there is no evidence of optic neuritis so I am going to resolve this problem however a referral to the neuro-ophthalmologist at [facility] is in order just to be clear and rule all of this out  The brain MRI is abnormal but the neurologist tells me that is within normal limits she will have it reviewed with the multiple sclerosis neuroradiologist

## 2021-01-06 NOTE — Patient Instructions (Addendum)
Begin chlorthalidone 1 daily  Follow a healthy diet as outlined to help with blood pressure and reduce cholesterol  Dr. Delford Field will communicate with your eye doctor and neurologist to determine what the next steps need to be for your vision loss and headaches  Meloxicam was refilled for your foot pain and will make a referral to podiatry to evaluate your feet  Return to see Dr. Delford Field in 3 months for primary care follow-up  Comenzar clortalidona 1 da  Siga una dieta saludable como se describe para ayudar con la presin arterial y reducir Print production planner.  El Dr. Delford Field se comunicar con su oftalmlogo y neurlogo para determinar cules deben ser los siguientes pasos para su prdida de visin y dolores de [place].  Meloxicam fue reabastecido para su dolor de pie y lo derivar a podologa para evaluar sus pies.  Volver a ver al Dr. Delford Field en 3 meses para el seguimiento de atencin primaria

## 2021-01-06 NOTE — Telephone Encounter (Signed)
-----   Message from Anson Fret, MD sent at 01/04/2021  8:26 PM EDT ----- Please let patient know that the new MRI doesn't show any abnormality of either eye. Great news. If she is still having problems and wants to follow up, we can offer her an appointment with Amy or Megan. But I also know that she was supposed to see a neuro-ophthalmologist so she may not need to see [place]. thanks

## 2021-01-06 NOTE — Progress Notes (Signed)
New Patient Office Visit  Subjective:  Patient ID: Heidi Rogers, female    DOB: 03-19-1966  Age: 55 y.o. MRN: [id number]  CC: decreased right eye vision. Bilateral foot pain, transition to new PCP  HPI Heidi Rogers presents for est PCP  hx HTN, HLD , Feet issues visual loss   This patient is a 55 year old female and this visit was assisted with Spanish interpreter Byrd Hesselbach from video interpreter services.  Patient is a former patient internal medicine teaching clinic last seen in April of this year.  She has a history of borderline hypertension yet to be treated.  She does have hyperlipidemia and she is on diet alone for treatment.  She has 2-year history of loss of vision in the right eye it feels as though a veil is over the eye and she cannot see clearly through the eye.  She initially was seen at [facility] and they were to refer her to neuro-ophthalmology but before that the patient was referred to neurology.  Patient's been seen by Dr. Daisy Blossom there.  She had a questionable abnormal optic nerve MRI in February repeat study just done July 1 showed normal optic nerve and orbits.  However she does have some minimal subcortical changes on the brain which need to be further reviewed to rule out multiple sclerosis or other small vessel disease.  Patient also has chronic bilateral foot pain for which she went to the mobile medicine unit and may and received meloxicam she states it does help to some degree.  She works in [employer] on her feet all day and has had made a change in footwear which helps a little bit.  She is had migraine headaches for many years headaches come and go and are relieved with over-the-counter pain medicine.  The patient states the eye condition started 2 years ago and does not appear to be related to the headaches.  On arrival blood pressure is 140/84. [place] link is reviewed in its entirety including all prior specialty notes.  I did not have visualization of the  ophthalmology notes  Past Medical History:  Diagnosis Date   High cholesterol    Hypertension    Migraines     Past Surgical History:  Procedure Laterality Date   TUBAL LIGATION      Family History  Problem Relation Age of Onset   Migraines Neg Hx     Social History   Socioeconomic History   Marital status: Single    Spouse name: Not on file   Number of children: 5   Years of education: Not on file   Highest education level: 2nd grade  Occupational History   Not on file  Tobacco Use   Smoking status: Never   Smokeless tobacco: Never  Vaping Use   Vaping Use: Never used  Substance and Sexual Activity   Alcohol use: No   Drug use: Not Currently   Sexual activity: Not Currently    Birth control/protection: Surgical, Post-menopausal  Other Topics Concern   Not on file  Social History Narrative   Lives with her daughter   Right handed   Caffeine: 2 cups coffee/day   Social Determinants of Health   Financial Resource Strain: Not on file  Food Insecurity: No Food Insecurity   Worried About Running Out of Food in the Last Year: Never true   Ran Out of Food in the Last Year: Never true  Transportation Needs: No Transportation Needs   Lack  of Transportation (Medical): No   Lack of Transportation (Non-Medical): No  Physical Activity: Not on file  Stress: Not on file  Social Connections: Not on file  Intimate Partner Violence: Not on file    ROS Review of Systems  Constitutional:  Negative for fatigue and fever.  HENT: Negative.    Eyes:  Positive for visual disturbance. Negative for photophobia, pain, discharge, redness and itching.  Respiratory: Negative.    Cardiovascular: Negative.   Gastrointestinal: Negative.   Genitourinary: Negative.   Musculoskeletal:        Foot pain  Skin: Negative.   Psychiatric/Behavioral: Negative.     Objective:   Today's Vitals: BP (!) 143/81 (BP Location: Left Arm, Patient Position: Sitting, Cuff Size: Normal)   Pulse  68   Temp 98.2 F (36.8 C)   Resp 16   Ht 5' 2.01" (1.575 m)   Wt 148 lb 3.2 oz (67.2 kg)   LMP 02/01/2018   SpO2 98%   BMI 27.10 kg/m   Physical Exam Constitutional:      General: She is not in acute distress.    Appearance: Normal appearance. She is obese. She is not ill-appearing.  HENT:     Head: Normocephalic and atraumatic.     Nose: Nose normal.  Eyes:     Extraocular Movements: Extraocular movements intact.     Conjunctiva/sclera: Conjunctivae normal.     Pupils: Pupils are equal, round, and reactive to light.  Cardiovascular:     Rate and Rhythm: Normal rate and regular rhythm.     Pulses: Normal pulses.     Heart sounds: Normal heart sounds.  Pulmonary:     Effort: Pulmonary effort is normal.     Breath sounds: Normal breath sounds.  Abdominal:     General: Abdomen is flat. Bowel sounds are normal.     Palpations: Abdomen is soft.  Musculoskeletal:     Cervical back: Normal range of motion.  Skin:    General: Skin is warm and dry.  Neurological:     General: No focal deficit present.     Mental Status: She is alert and oriented to person, place, and time. Mental status is at baseline.  Psychiatric:        Mood and Affect: Mood normal.        Behavior: Behavior normal.        Thought Content: Thought content normal.        Judgment: Judgment normal.    Assessment & Plan:   Problem List Items Addressed This Visit       Cardiovascular and Mediastinum   Other migraine without status migrainosus, not intractable   Relevant Medications   meloxicam (MOBIC) 7.5 MG tablet   chlorthalidone (HYGROTON) 25 MG tablet   Primary hypertension - Primary   Relevant Medications   chlorthalidone (HYGROTON) 25 MG tablet     Other   Elevated cholesterol   Relevant Medications   chlorthalidone (HYGROTON) 25 MG tablet   Blurred vision, right eye   Relevant Orders   Ambulatory referral to Ophthalmology   Foot pain, bilateral   Relevant Medications   meloxicam  (MOBIC) 7.5 MG tablet   Other Relevant Orders   Ambulatory referral to Podiatry   Other Visit Diagnoses     Optic neuritis       Relevant Orders   Ambulatory referral to Ophthalmology       Outpatient Encounter Medications as of 01/06/2021  Medication Sig   chlorthalidone (HYGROTON) 25 MG  tablet Take 1 tablet (25 mg total) by mouth daily.   meloxicam (MOBIC) 7.5 MG tablet Take 1 tablet (7.5 mg total) by mouth daily.   [DISCONTINUED] acetaminophen (TYLENOL 8 HOUR) 650 MG CR tablet Take 1 tablet (650 mg total) by mouth every 8 (eight) hours as needed for pain.   [DISCONTINUED] aspirin-acetaminophen-caffeine (EXCEDRIN MIGRAINE) 250-250-65 MG tablet Take 1 tablet by mouth every 6 (six) hours as needed for headache.   [DISCONTINUED] meloxicam (MOBIC) 7.5 MG tablet Take 1 tablet (7.5 mg total) by mouth daily. (Patient not taking: Reported on 01/06/2021)   No facility-administered encounter medications on file as of 01/06/2021.   49 minutes spent reviewing records performing an exam obtaining history using Spanish language interpretation and having a short "phone call with the patient's neurologist for collaboration and complex medical decision making and referral to neuro-ophthalmology Follow-up: Return in about 3 months (around 04/08/2021).   Shan Levans, MD

## 2021-01-06 NOTE — Assessment & Plan Note (Signed)
Will not treat for now for now we will provide healthy diet

## 2021-01-06 NOTE — Telephone Encounter (Signed)
Pt verified by name and DOB,  normal results given per provider, pt voiced understanding all question answered.   States she will call back if she needs fu appt with NP.

## 2021-01-06 NOTE — Assessment & Plan Note (Signed)
As per migraine assessment

## 2021-01-06 NOTE — Progress Notes (Signed)
Pt presents to establish care, pt has been experiencing bilateral foot edema for approx. 4 months states that causes her to have to leave work early Pt had prev MRI  wants to discuss results

## 2021-01-10 ENCOUNTER — Other Ambulatory Visit: Payer: Self-pay | Admitting: Neurology

## 2021-01-10 DIAGNOSIS — H5460 Unqualified visual loss, one eye, unspecified: Secondary | ICD-10-CM

## 2021-01-11 NOTE — Telephone Encounter (Signed)
Jael has left a voice mail for patient to call back to schedule vision test

## 2021-01-19 ENCOUNTER — Other Ambulatory Visit: Payer: Self-pay | Admitting: Podiatry

## 2021-01-19 ENCOUNTER — Other Ambulatory Visit: Payer: Self-pay

## 2021-01-19 ENCOUNTER — Ambulatory Visit (INDEPENDENT_AMBULATORY_CARE_PROVIDER_SITE_OTHER): Payer: No Typology Code available for payment source | Admitting: Podiatry

## 2021-01-19 ENCOUNTER — Ambulatory Visit (INDEPENDENT_AMBULATORY_CARE_PROVIDER_SITE_OTHER): Payer: No Typology Code available for payment source

## 2021-01-19 DIAGNOSIS — M659 Synovitis and tenosynovitis, unspecified: Secondary | ICD-10-CM

## 2021-01-19 DIAGNOSIS — M79671 Pain in right foot: Secondary | ICD-10-CM

## 2021-01-19 DIAGNOSIS — M778 Other enthesopathies, not elsewhere classified: Secondary | ICD-10-CM

## 2021-01-19 MED ORDER — MELOXICAM 15 MG PO TABS: 15.0000 mg | ORAL_TABLET | Freq: Every day | ORAL | 1 refills | Status: DC

## 2021-01-19 MED ORDER — METHYLPREDNISOLONE 4 MG PO TBPK: ORAL_TABLET | ORAL | 0 refills | Status: DC

## 2021-01-19 NOTE — Progress Notes (Signed)
   HPI: 55 y.o. female presenting today presenting today as a new patient for evaluation of bilateral foot and ankle pain.  Patient denies a history of injury.  She says the pain has gradually progressed over the last 3 months.  She works on her feet for approximately 5 to 6 hours/day at [employer].  She has been taking 6 meloxicam 7.5 mg daily with no relief.  She presents for further treatment and evaluation  Past Medical History:  Diagnosis Date   High cholesterol    Hypertension    Migraines      Physical Exam: General: The patient is alert and oriented x3 in no acute distress.  Dermatology: Skin is warm, dry and supple bilateral lower extremities. Negative for open lesions or macerations.  Vascular: Palpable pedal pulses bilaterally. No edema or erythema noted. Capillary refill within normal limits.  Neurological: Epicritic and protective threshold grossly intact bilaterally.   Musculoskeletal Exam: Range of motion within normal limits to all pedal and ankle joints bilateral. Muscle strength 5/5 in all groups bilateral.  Pain on palpation to the bilateral ankles and midfoot  Radiographic Exam:  Normal osseous mineralization. Joint spaces preserved. No fracture/dislocation/boney destruction.    Assessment: 1.  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  Chronic pain bilateral midfoot   Plan of Care:  1. Patient evaluated. X-Rays reviewed.  2.  Recommend that the patient wears good supportive arch supports to support the arch and structures of her foot during work 3.  Prescription for Medrol Dosepak 4.  Prescription for meloxicam 15 mg daily. 5.  Return to clinic as needed      Felecia Shelling, DPM [facility]  Dr. Felecia Shelling, DPM    [address] [place], [place] [address]                Office [phone]  Fax [phone]

## 2021-01-25 ENCOUNTER — Other Ambulatory Visit: Payer: Self-pay

## 2021-01-25 ENCOUNTER — Ambulatory Visit (INDEPENDENT_AMBULATORY_CARE_PROVIDER_SITE_OTHER): Payer: Self-pay | Admitting: Neurology

## 2021-01-25 DIAGNOSIS — H538 Other visual disturbances: Secondary | ICD-10-CM

## 2021-01-25 DIAGNOSIS — H5460 Unqualified visual loss, one eye, unspecified: Secondary | ICD-10-CM

## 2021-01-25 NOTE — Progress Notes (Signed)
        Full Name: Heidi Rogers Gender: Female MRN #: [id number] Date of Birth: [birth year]    Visit Date: 01/25/2021 10:13 Age: 55 Years Examining Physician: Despina Arias, MD Referring Physician: Naomie Dean, MD     History: She is a 55 year old woman with visual disturbance  Description:  The visual evoked response test was performed today using 32 x 32 check sizes. .The P100 latencies were normal.  Specifically the latency was 112 ms on the right and 111 ms on the left.   The amplitudes for the P100 wave forms were also within normal limits bilaterally.   Impression: The visual evoked response test above was within normal limits bilaterally.  No evidence of conduction slowing was seen within the anterior visual pathways on either side on today's evaluation.  Jozalyn Baglio A. Epimenio Foot, MD, PhD, FAAN Certified in Neurology, Clinical Neurophysiology, Sleep Medicine, Pain Medicine and Neuroimaging Director, [facility] at [facility]  [facility] [address], [address] [place], [place] [address] [phone]  Verbal informed consent was obtained from the patient, patient was informed of potential risk of procedure, including bruising, bleeding, hematoma formation, infection, muscle weakness, muscle pain, numbness, among others.        VEP-Pattern    Run Label N75 P100 N145 P100 Size    ms ms ms V   R  - VEP  1.1  O1-Fz 83 112 149 17.0 16  1.2  Oz-Fz 80 112 149 17.0   1.3  O2-Fz 80 113 149 13.7   L  - VEP  1.1  O1-Fz 79 111 146 14.0 16  1.2  Oz-Fz 76 111 146 14.6   1.3  O2-Fz 76 110 146 12.1

## 2021-01-27 ENCOUNTER — Telehealth: Payer: Self-pay | Admitting: *Deleted

## 2021-01-27 NOTE — Telephone Encounter (Signed)
-----   Message from Anson Fret, MD sent at 01/25/2021  1:02 PM EDT ----- Toma Copier, let patient know her visual evoked potentials normal. This means her optic nerves are normal. Thanks (cc Dr. Delford Field).   ----- Message ----- From: Asa Lente, MD Sent: 01/25/2021   1:00 PM EDT To: Anson Fret, MD

## 2021-01-27 NOTE — Telephone Encounter (Signed)
Spoke with pt's daughter Greer Ee on [place]) and advised pt's visual evoked potentials test was normal which means her optic nerves are normal. Daughter was already aware that patient had the test done and she stated she would let the patient know of the normal results. She verbalized appreciation for the call and did not have any questions.

## 2021-02-21 ENCOUNTER — Other Ambulatory Visit: Payer: Self-pay

## 2021-02-21 ENCOUNTER — Ambulatory Visit: Payer: No Typology Code available for payment source | Attending: Critical Care Medicine

## 2021-04-06 ENCOUNTER — Other Ambulatory Visit: Payer: Self-pay

## 2021-04-06 ENCOUNTER — Ambulatory Visit: Payer: Self-pay | Attending: Critical Care Medicine

## 2021-04-27 ENCOUNTER — Other Ambulatory Visit: Payer: Self-pay

## 2021-04-27 ENCOUNTER — Ambulatory Visit: Payer: Self-pay | Attending: Critical Care Medicine | Admitting: Critical Care Medicine

## 2021-04-27 ENCOUNTER — Encounter: Payer: Self-pay | Admitting: Critical Care Medicine

## 2021-04-27 VITALS — BP 133/76 | HR 62 | Resp 16 | Wt 144.0 lb

## 2021-04-27 DIAGNOSIS — M79672 Pain in left foot: Secondary | ICD-10-CM

## 2021-04-27 DIAGNOSIS — Z1211 Encounter for screening for malignant neoplasm of colon: Secondary | ICD-10-CM

## 2021-04-27 DIAGNOSIS — G43009 Migraine without aura, not intractable, without status migrainosus: Secondary | ICD-10-CM

## 2021-04-27 DIAGNOSIS — H538 Other visual disturbances: Secondary | ICD-10-CM

## 2021-04-27 DIAGNOSIS — I10 Essential (primary) hypertension: Secondary | ICD-10-CM

## 2021-04-27 DIAGNOSIS — G43809 Other migraine, not intractable, without status migrainosus: Secondary | ICD-10-CM

## 2021-04-27 DIAGNOSIS — M79671 Pain in right foot: Secondary | ICD-10-CM

## 2021-04-27 DIAGNOSIS — H469 Unspecified optic neuritis: Secondary | ICD-10-CM

## 2021-04-27 MED ORDER — CHLORTHALIDONE 25 MG PO TABS
25.0000 mg | ORAL_TABLET | Freq: Every day | ORAL | 1 refills | Status: DC
  Filled 2021-04-27: qty 90, 90d supply, fill #0
  Filled 2021-08-10: qty 30, 30d supply, fill #0
  Filled 2021-09-20: qty 30, 30d supply, fill #1
  Filled 2021-11-08: qty 30, 30d supply, fill #2

## 2021-04-27 MED ORDER — SUMATRIPTAN SUCCINATE 50 MG PO TABS
50.0000 mg | ORAL_TABLET | ORAL | 0 refills | Status: DC | PRN
  Filled 2021-04-27: qty 10, 30d supply, fill #0

## 2021-04-27 MED ORDER — MELOXICAM 15 MG PO TABS
15.0000 mg | ORAL_TABLET | Freq: Every day | ORAL | 1 refills | Status: DC
  Filled 2021-04-27 – 2021-09-20 (×2): qty 30, 30d supply, fill #0

## 2021-04-27 NOTE — Assessment & Plan Note (Signed)
Due to bilateral ankle capsulitis improved with nonsteroidal anti-inflammatory continue same and continue inserts in the feet

## 2021-04-27 NOTE — Patient Instructions (Signed)
Begin a trial of Imitrex as needed for headaches and review migraine headache handout attached  Referral to neuro-ophthalmology will be made again  Referral back to neurology was made for migraine evaluation  No change in chlorthalidone stay on this for blood pressure  Tetanus shot was given at this visit Colon cancer screening kit was issued please process and return to clinic lab  Return to see Dr. Joya Gaskins 3 months  Comience una prueba de Imitrex segn sea necesario para los dolores de [place] y revise el folleto adjunto sobre la migraa  Se realizar nuevamente la derivacin a neurooftalmologa  Se realiz una remisin a neurologa para la evaluacin de la migraa.  No hay cambios en la clortalidona, mantngase en esto para la presin arterial.  La vacuna contra el ttanos se administr en esta visita. Se emiti un kit de deteccin de cncer de colon, procese y devulvalo al laboratorio de la clnica.  Volver a ver al Dr. Joya Gaskins 3 meses

## 2021-04-27 NOTE — Assessment & Plan Note (Signed)
Recurrent migraine syndrome plan trial of Imitrex as needed and will refer back to neurology

## 2021-04-27 NOTE — Assessment & Plan Note (Signed)
Controlled on chlorthalidone continue current medicines

## 2021-04-27 NOTE — Assessment & Plan Note (Signed)
Loss of vision in right eye negative exam by primary ophthalmologist question of optic neuritis on MRI that was not proven on the subsequent study  I would like an opinion of a neuro-ophthalmologist I tried to refer the patient in July this did not go through all said yet another referral

## 2021-04-27 NOTE — Progress Notes (Signed)
Established Patient Office Visit  Subjective:  Patient ID: Heidi Rogers, female    DOB: 1965/08/12  Age: 55 y.o. MRN: [id number]  CC:  Chief Complaint  Patient presents with   Hypertension   Headache    HPI Spanish interpreter achieved with Heidi Rogers #[id number] Heidi Rogers presents for primary care follow-up.  Patient continues to have migraine type headaches that occur nearly every day.  They are not intractable.  They are not relieved with ibuprofen.  She continues have loss of vision in the right eye.  She never received her neuro-ophthalmology referral.  She does not have a follow-up appointment with neurology.  On arrival blood pressures 133/76.  Note patient did see podiatry and had an ankle capsulitis bilaterally and was treated with steroids and nonsteroidal anti-inflammatory with marked improvement she also has an insert on her shoes now.  Patient has no other real complaints at this time.  She agreed to and received a flu vaccine on arrival.     Past Medical History:  Diagnosis Date   High cholesterol    Hypertension    Migraines     Past Surgical History:  Procedure Laterality Date   TUBAL LIGATION      Family History  Problem Relation Age of Onset   Migraines Neg Hx     Social History   Socioeconomic History   Marital status: Single    Spouse name: Not on file   Number of children: 5   Years of education: Not on file   Highest education level: 2nd grade  Occupational History   Not on file  Tobacco Use   Smoking status: Never   Smokeless tobacco: Never  Vaping Use   Vaping Use: Never used  Substance and Sexual Activity   Alcohol use: No   Drug use: Not Currently   Sexual activity: Not Currently    Birth control/protection: Surgical, Post-menopausal  Other Topics Concern   Not on file  Social History Narrative   Lives with her daughter   Right handed   Caffeine: 2 cups coffee/day   Social Determinants of Health   Financial Resource  Strain: Not on file  Food Insecurity: No Food Insecurity   Worried About Programme researcher, broadcasting/film/video in the Last Year: Never true   Ran Out of Food in the Last Year: Never true  Transportation Needs: No Transportation Needs   Lack of Transportation (Medical): No   Lack of Transportation (Non-Medical): No  Physical Activity: Not on file  Stress: Not on file  Social Connections: Not on file  Intimate Partner Violence: Not on file    Outpatient Medications Prior to Visit  Medication Sig Dispense Refill   chlorthalidone (HYGROTON) 25 MG tablet Take 1 tablet (25 mg total) by mouth daily. 90 tablet 1   meloxicam (MOBIC) 15 MG tablet Take 1 tablet (15 mg total) by mouth daily. 30 tablet 1   methylPREDNISolone (MEDROL DOSEPAK) 4 MG TBPK tablet 6 day dose pack - take as directed (Patient not taking: Reported on 04/27/2021) 21 tablet 0   No facility-administered medications prior to visit.    No Known Allergies  ROS Review of Systems  Constitutional:  Negative for chills, diaphoresis and fever.  HENT:  Negative for congestion, hearing loss, nosebleeds, sore throat and tinnitus.   Eyes:  Positive for visual disturbance. Negative for photophobia and redness.  Respiratory:  Negative for cough, shortness of breath, wheezing and stridor.   Cardiovascular:  Negative for chest  pain, palpitations and leg swelling.  Gastrointestinal:  Negative for abdominal pain, blood in stool, constipation, diarrhea, nausea and vomiting.  Endocrine: Negative for polydipsia.  Genitourinary:  Negative for dysuria, flank pain, frequency, hematuria and urgency.  Musculoskeletal:  Negative for back pain, myalgias and neck pain.  Skin:  Negative for rash.  Allergic/Immunologic: Negative for environmental allergies.  Neurological:  Positive for headaches. Negative for dizziness, tremors, seizures and weakness.  Hematological:  Does not bruise/bleed easily.  Psychiatric/Behavioral: Negative.  Negative for suicidal ideas. The  patient is not nervous/anxious.      Objective:    Physical Exam Vitals reviewed.  Constitutional:      Appearance: Normal appearance. She is well-developed. She is obese. She is not diaphoretic.  HENT:     Head: Normocephalic and atraumatic.     Nose: No nasal deformity, septal deviation, mucosal edema or rhinorrhea.     Right Sinus: No maxillary sinus tenderness or frontal sinus tenderness.     Left Sinus: No maxillary sinus tenderness or frontal sinus tenderness.     Mouth/Throat:     Pharynx: No oropharyngeal exudate.  Eyes:     General: No scleral icterus.    Conjunctiva/sclera: Conjunctivae normal.     Pupils: Pupils are equal, round, and reactive to light.  Neck:     Thyroid: No thyromegaly.     Vascular: No carotid bruit or JVD.     Trachea: Trachea normal. No tracheal tenderness or tracheal deviation.  Cardiovascular:     Rate and Rhythm: Normal rate and regular rhythm.     Chest Wall: PMI is not displaced.     Pulses: Normal pulses. No decreased pulses.     Heart sounds: Normal heart sounds, S1 normal and S2 normal. Heart sounds not distant. No murmur heard. No systolic murmur is present.  No diastolic murmur is present.    No friction rub. No gallop. No S3 or S4 sounds.  Pulmonary:     Effort: No tachypnea, accessory muscle usage or respiratory distress.     Breath sounds: No stridor. No decreased breath sounds, wheezing, rhonchi or rales.  Chest:     Chest wall: No tenderness.  Abdominal:     General: Bowel sounds are normal. There is no distension.     Palpations: Abdomen is soft. Abdomen is not rigid.     Tenderness: There is no abdominal tenderness. There is no guarding or rebound.  Musculoskeletal:        General: Normal range of motion.     Cervical back: Normal range of motion and neck supple. No edema, erythema or rigidity. No muscular tenderness. Normal range of motion.  Lymphadenopathy:     Head:     Right side of head: No submental or submandibular  adenopathy.     Left side of head: No submental or submandibular adenopathy.     Cervical: No cervical adenopathy.  Skin:    General: Skin is warm and dry.     Coloration: Skin is not pale.     Findings: No rash.     Nails: There is no clubbing.  Neurological:     Mental Status: She is alert and oriented to person, place, and time.     Sensory: No sensory deficit.  Psychiatric:        Speech: Speech normal.        Behavior: Behavior normal.    BP 133/76   Pulse 62   Resp 16   Wt 144 lb (65.3  kg)   LMP 02/01/2018   SpO2 96%   BMI 26.33 kg/m  Wt Readings from Last 3 Encounters:  04/27/21 144 lb (65.3 kg)  01/06/21 148 lb 3.2 oz (67.2 kg)  11/24/20 150 lb (68 kg)     Health Maintenance Due  Topic Date Due   COLON CANCER SCREENING ANNUAL FOBT  Never done    There are no preventive care reminders to display for this patient.  Lab Results  Component Value Date   TSH 2.150 08/04/2020   Lab Results  Component Value Date   WBC 7.1 08/04/2020   HGB 14.1 08/04/2020   HCT 42.8 08/04/2020   MCV 85 08/04/2020   PLT 258 08/04/2020   Lab Results  Component Value Date   NA 138 08/04/2020   K 4.2 08/04/2020   CO2 22 08/04/2020   GLUCOSE 107 (H) 10/06/2020   BUN 10 08/04/2020   CREATININE 0.51 (L) 08/04/2020   BILITOT 0.4 08/04/2020   ALKPHOS 121 08/04/2020   AST 13 08/04/2020   ALT 19 08/04/2020   PROT 6.8 08/04/2020   ALBUMIN 4.4 08/04/2020   CALCIUM 9.1 08/04/2020   ANIONGAP 6 05/04/2018   Lab Results  Component Value Date   CHOL 204 (H) 10/06/2020   Lab Results  Component Value Date   HDL 37 (L) 10/06/2020   Lab Results  Component Value Date   LDLCALC 147 (H) 10/06/2020   Lab Results  Component Value Date   TRIG 109 10/06/2020   Lab Results  Component Value Date   CHOLHDL 5.5 (H) 10/06/2020   Lab Results  Component Value Date   HGBA1C 5.6 10/06/2020      Assessment & Plan:   Problem List Items Addressed This Visit       Cardiovascular  and Mediastinum   Other migraine without status migrainosus, not intractable    Recurrent migraine syndrome plan trial of Imitrex as needed and will refer back to neurology      Relevant Medications   meloxicam (MOBIC) 15 MG tablet   chlorthalidone (HYGROTON) 25 MG tablet   SUMAtriptan (IMITREX) 50 MG tablet   Primary hypertension    Controlled on chlorthalidone continue current medicines      Relevant Medications   chlorthalidone (HYGROTON) 25 MG tablet     Other   Blurred vision, right eye    Loss of vision in right eye negative exam by primary ophthalmologist question of optic neuritis on MRI that was not proven on the subsequent study  I would like an opinion of a neuro-ophthalmologist I tried to refer the patient in July this did not go through all said yet another referral      Relevant Orders   Ambulatory referral to Ophthalmology   Foot pain, bilateral    Due to bilateral ankle capsulitis improved with nonsteroidal anti-inflammatory continue same and continue inserts in the feet      Other Visit Diagnoses     Colon cancer screening    -  Primary   Relevant Orders   Fecal occult blood, imunochemical   Optic neuritis       Relevant Orders   Ambulatory referral to Ophthalmology   Migraine without aura and without status migrainosus, not intractable       Relevant Medications   meloxicam (MOBIC) 15 MG tablet   chlorthalidone (HYGROTON) 25 MG tablet   SUMAtriptan (IMITREX) 50 MG tablet   Other Relevant Orders   Ambulatory referral to Neurology  Meds ordered this encounter  Medications   meloxicam (MOBIC) 15 MG tablet    Sig: Take 1 tablet (15 mg total) by mouth daily.    Dispense:  30 tablet    Refill:  1   chlorthalidone (HYGROTON) 25 MG tablet    Sig: Take 1 tablet (25 mg total) by mouth daily.    Dispense:  90 tablet    Refill:  1   SUMAtriptan (IMITREX) 50 MG tablet    Sig: Take 1 tablet (50 mg total) by mouth every 2 (two) hours as needed for  migraine. May repeat in 2 hours if headache persists or recurs.    Dispense:  10 tablet    Refill:  0  Colon cancer screening will be issued again  Follow-up: Return in about 3 months (around 07/28/2021).    Shan Levans, MD

## 2021-07-22 ENCOUNTER — Telehealth: Payer: Self-pay | Admitting: Critical Care Medicine

## 2021-07-22 NOTE — Telephone Encounter (Signed)
Copied from CRM [id number]. Topic: Referral - Request for Referral >> Jul 21, 2021  4:06 PM Gaetana Michaelis A wrote: Has patient seen PCP for this complaint? Yes.   *If NO, is insurance requiring patient see PCP for this issue before PCP can refer them? Referral for which specialty: Neuro Opthalmology  Preferred provider/office: Patient has no preference  Reason for referral: blurred vision

## 2021-08-03 ENCOUNTER — Telehealth: Payer: Self-pay

## 2021-08-03 NOTE — Telephone Encounter (Signed)
Health Coaching 3  interpreter- Rudene Anda, [facility]   Goals- Patient has been exercising every day for at least 10 minutes. Patient has been trying to practice a heart healthy diet. Patient has been trying to eat vegetables and greens daily. Patient has also cut out fried and fatty foods from her diet.    New goal- Patient would still like to work on improving her diet and making healthy food choices as well as continuing to add in more daily exercise.  Barrier to reaching goal-    Strategies to overcome-    Navigation:  Patient is aware of  a follow up session. Patient is scheduled for follow-up appointment on Wednesday, September 07, 2021 @ 10:30 am.   Time-  10 minutes

## 2021-08-10 ENCOUNTER — Other Ambulatory Visit: Payer: Self-pay

## 2021-09-07 ENCOUNTER — Other Ambulatory Visit: Payer: Self-pay

## 2021-09-07 ENCOUNTER — Inpatient Hospital Stay: Payer: No Typology Code available for payment source | Attending: Obstetrics and Gynecology | Admitting: *Deleted

## 2021-09-07 VITALS — BP 122/80 | Ht 60.0 in | Wt 145.9 lb

## 2021-09-07 DIAGNOSIS — Z1231 Encounter for screening mammogram for malignant neoplasm of breast: Secondary | ICD-10-CM

## 2021-09-07 DIAGNOSIS — Z0000 Encounter for general adult medical examination without abnormal findings: Secondary | ICD-10-CM

## 2021-09-07 IMAGING — MG MM DIGITAL SCREENING BILAT W/ TOMO AND CAD
8 series · 8 of 24 positions shown · non-contrast
Comparison: Previous exam(s).

CLINICAL DATA: Screening.

EXAM:
DIGITAL SCREENING BILATERAL MAMMOGRAM WITH TOMOSYNTHESIS AND CAD
TECHNIQUE: Bilateral screening digital craniocaudal and mediolateral oblique
mammograms were obtained. Bilateral screening digital breast
tomosynthesis was performed. The images were evaluated with
computer-aided detection.

[L CC synth-2D]
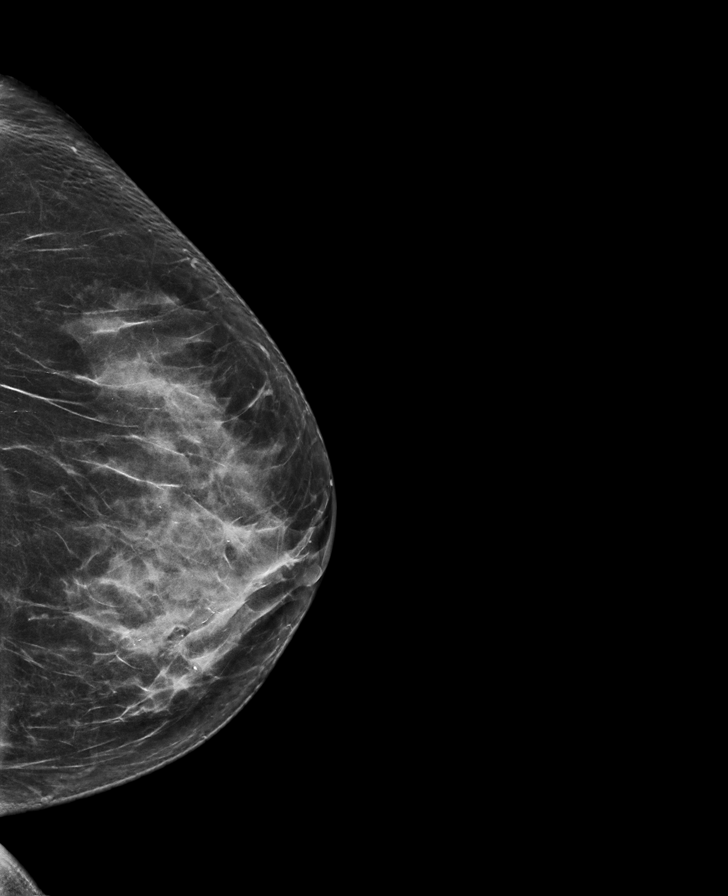

[R MLO synth-2D]
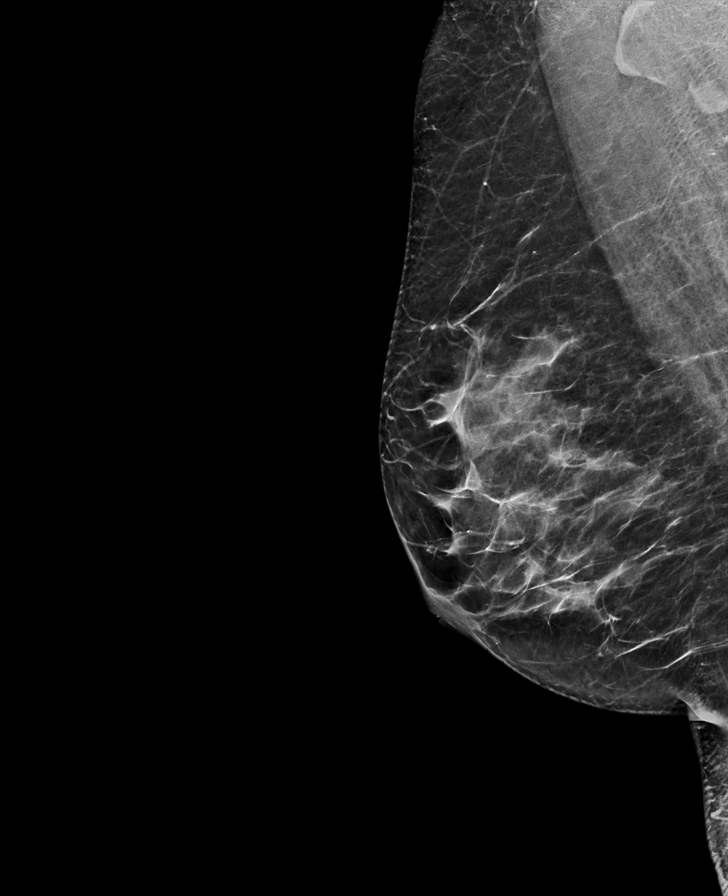

[R CC synth-2D]
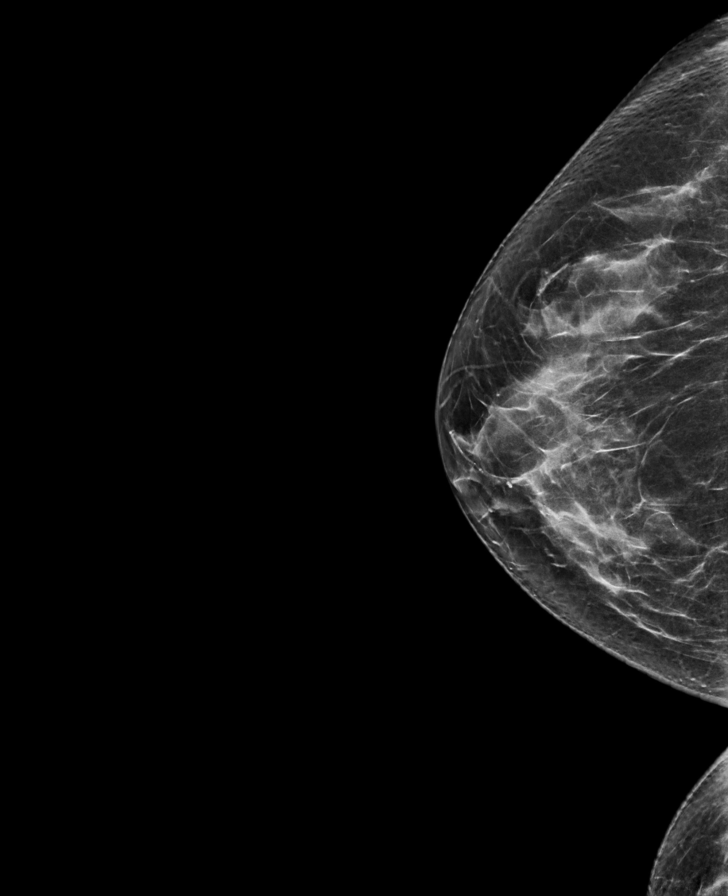

[L MLO synth-2D]
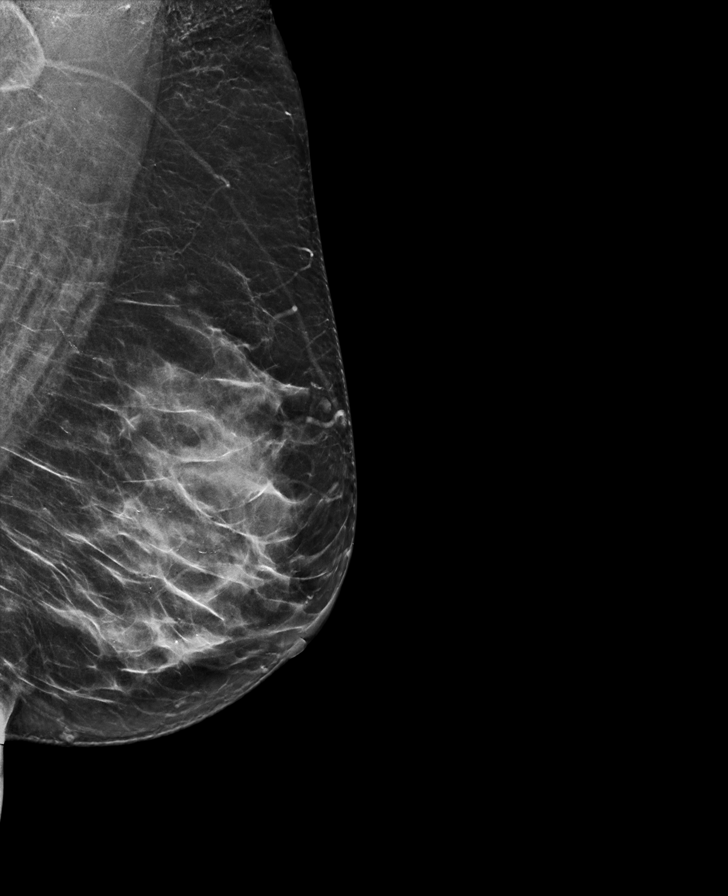

[L CC tomo · tomo slice 34/67.0]
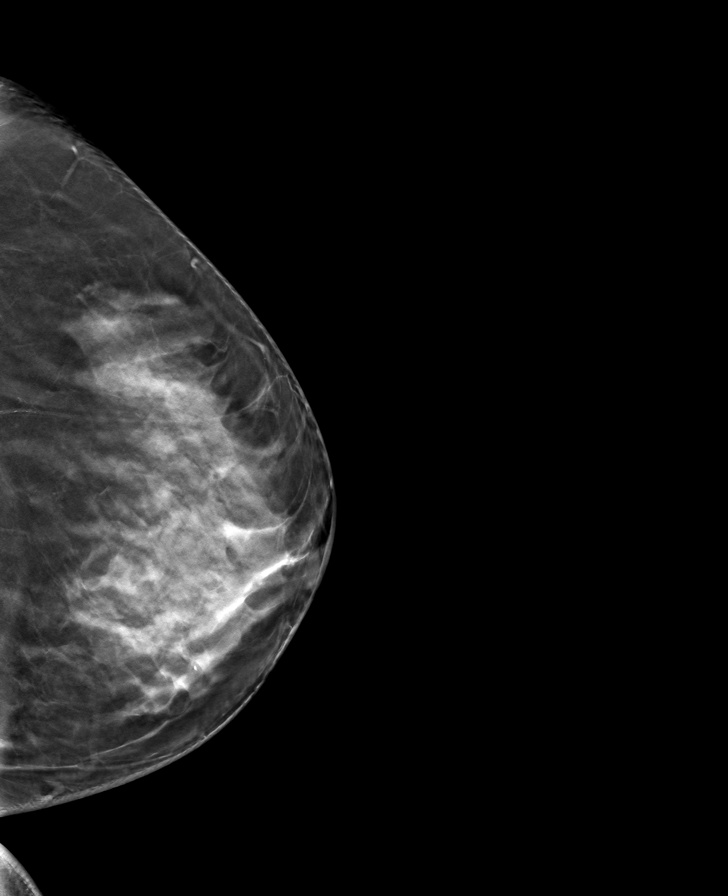

[L MLO tomo · tomo slice 36/71.0]
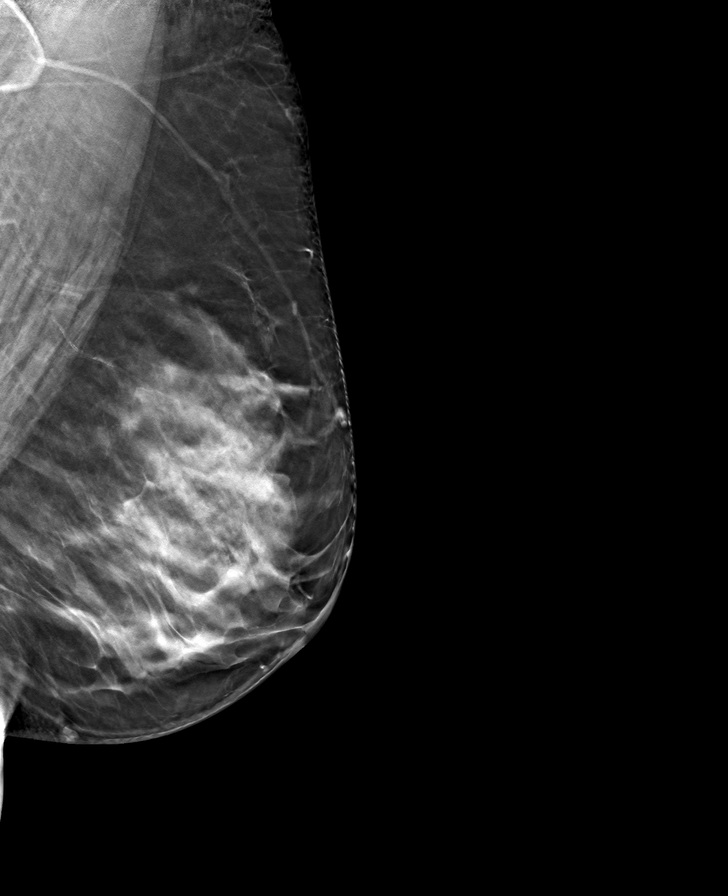

[R MLO tomo · tomo slice 33/66.0]
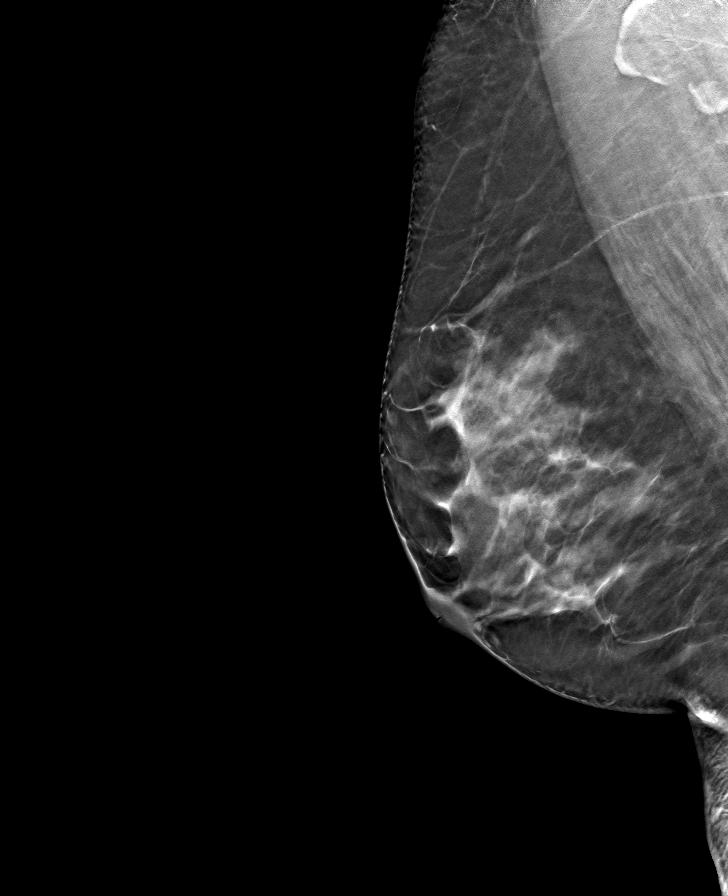

[R CC tomo · tomo slice 35/69.0]
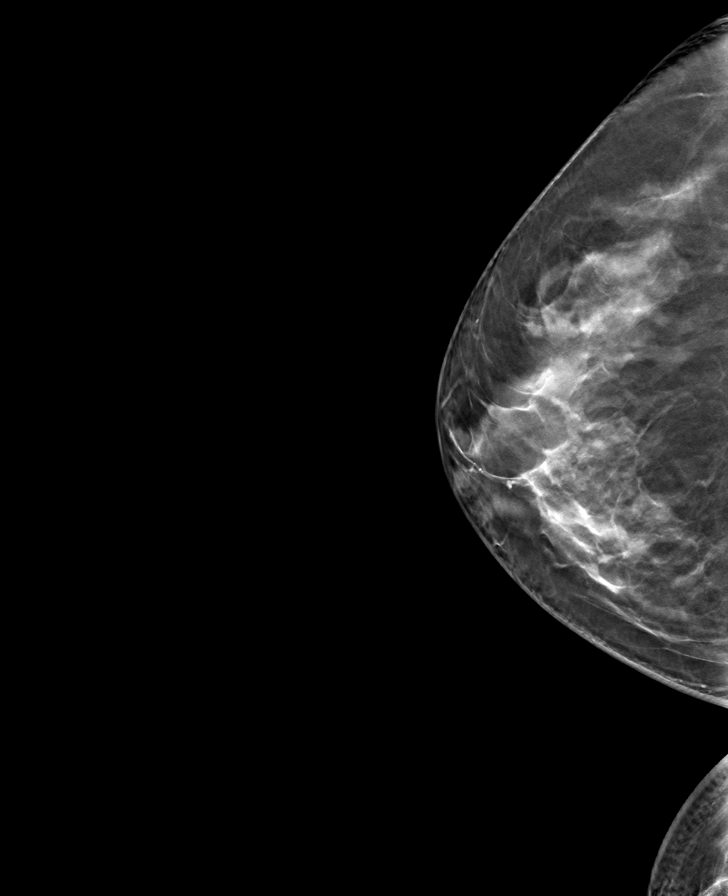

[8 of 24 positions shown; findings below may reference images not displayed]

ACR Breast Density Category c: The breast tissue is heterogeneously
dense, which may obscure small masses.
FINDINGS: There are no findings suspicious for malignancy. The images were
evaluated with computer-aided detection.
IMPRESSION: No mammographic evidence of malignancy. A result letter of this
screening mammogram will be mailed directly to the patient.

RECOMMENDATION:
Screening mammogram in one year. (Code:T4-5-GWO)

BI-RADS CATEGORY  1: Negative.

## 2021-09-07 NOTE — Progress Notes (Signed)
Wisewoman follow up ?  ?Interpreter: [name], [employer] ? ?Clinical Measurement:  There were no vitals filed for this visit.  ?  ?Medical History:  Patient states that she has high cholesterol, has high blood pressure and she does not have diabetes. ? ?Medications:  Patient states that she does take medication to lower cholesterol, blood pressure and blood sugar.  Patient does not take an aspirin a day to help prevent a heart attack or stroke. During the past 7 days patient has taken prescribed medication to lower blood sugar on 7 days. ?  ?Blood pressure, self measurement: Patient states that she does not measure blood pressure from home. She checks her blood pressure N/A. She shares her readings with a health care provider: N/A. ?  ?Nutrition: Patient states that on average she eats 2 cups of fruit and 1 cups of vegetables per day. Patient states that she does not eat fish at least 2 times per week. Patient eats about half servings of whole grains. Patient drinks less than 36 ounces of beverages with added sugar weekly: yes. Patient is currently watching sodium or salt intake: yes. In the past 7 days patient has had 0 drinks containing alcohol. On average patient drinks 0 drinks containing alcohol per day.     ? ?Physical activity:  Patient states that she gets 70 minutes of moderate and 0 minutes of vigorous physical activity each week. ? ?Smoking status:  Patient states that she has has never smoked . ?  ?Quality of life:  Over the past 2 weeks patient states that she had little interest or pleasure in doing things: several days. She has been feeling down, depressed or hopeless:several days.  ?  ?Risk reduction and counseling:  ? ?Health Coaching: Spoke with patient about trying to maintain a heart healthy diet. Increase fruit and vegetable intake. Increase the amount of heart healthy fish intake. Patient consumes oatmeal daily as well as whole grain cereals. Patient has been doing some exercising for about  10 minutes a day. Patient stated that she does have a treadmill at home and will try and start walking more.  ?  ?Navigation: This was the  follow up session for this patient, I will check up on her progress in the coming months. Gave patient resource information for [employer] of the [place] for counseling services. Patient had a shopping experience in the [employer]. Was able to help patient make some heart healthy food choices during her shopping experience. Patient is scheduled for re-screening on September 28, 2021. ? ?Time: 30 minutes  ?

## 2021-09-20 ENCOUNTER — Other Ambulatory Visit: Payer: Self-pay

## 2021-09-28 ENCOUNTER — Inpatient Hospital Stay: Payer: No Typology Code available for payment source | Admitting: *Deleted

## 2021-09-28 ENCOUNTER — Other Ambulatory Visit: Payer: Self-pay

## 2021-09-28 VITALS — BP 114/78 | Ht 60.0 in | Wt 149.1 lb

## 2021-09-28 DIAGNOSIS — Z0000 Encounter for general adult medical examination without abnormal findings: Secondary | ICD-10-CM

## 2021-09-28 NOTE — Progress Notes (Addendum)
Wisewoman initial screening ?  ?Interpreter- Rudene Anda, [place] ?  ?Clinical Measurement: There were no vitals filed for this visit. Fasting Labs Drawn Today, will review with patient when they result. ?  ?Medical History:  Patient states that she has high cholesterol, has high blood pressure and she does not have diabetes. ? ?Medications:  Patient states that she does take medication to lower blood pressure. Patient does not take medication to lower cholesterol and blood sugar. Patient does not take an aspirin a day to help prevent a heart attack or stroke. During the past 7 days patient has taken prescribed medication to lower blood pressure on 3 days. ?  ?Blood pressure, self measurement:  :  Patient states that she does not measure blood pressure from home. She checks her blood pressure N/A. She shares her readings with a health care provider: N/A. ?  ?Nutrition: Patient states that on average she eats 1 cups of fruit and 2 cups of vegetables per day. Patient states that she does not eat fish at least 2 times per week. Patient eats about half servings of whole grains. Patient drinks less than 36 ounces of beverages with added sugar weekly: yes. Patient is currently watching sodium or salt intake: yes. In the past 7 days patient has consumed drinks containing alcohol on 0 days. On a day that patient consumes drinks containing alcohol on average 0 drinks are consumed.     ? ?Physical activity:  Patient states that she gets 30 minutes of moderate and 0 minutes of vigorous physical activity each week. ? ?Smoking status:  Patient states that she has has never smoked . ?  ?Quality of life:  Over the past 2 weeks patient states that she had little interest or pleasure in doing things: several days. She has been feeling down, depressed or hopeless:several days.  ?  ?Risk reduction and counseling:  ? ?Health Coaching: Spoke with patient about daily fruit and vegetable intake. Patient consumes fish 1-2 times a  week.  Patient consumes a variety of whole grains (oatmeal, whole wheat bread and whole grain cereals). Patient has been walking 3 days a week for 10 minutes. Encouraged patient to try and exercise weekly exercise. Patient has set goal based on health and fitness. Patient states that she forgets to take BP medication and that's why she has only taken it on 3 days over the past week. Helped patient set daily alarm on phone to remind her to take her medication. ? ? ?Goal: Patient would like to work on overall health. Patient states that she would like to make healthier food choices and would like to exercise more. Patient would like to try and exercise 5 days a week for 10 minutes. Patient will take walks outside and use exercise bike that she has at home.  ?  ?Navigation:  I will notify patient of lab results.  Patient is aware of 2 more health coaching sessions and a follow up. Took patient to [employer] for shopping experience.  ? ?Time: 25 minutes  ?

## 2021-09-29 LAB — LIPID PANEL
Chol/HDL Ratio: 4.9 ratio — ABNORMAL HIGH (ref 0.0–4.4)
Cholesterol, Total: 202 mg/dL — ABNORMAL HIGH (ref 100–199)
HDL: 41 mg/dL (ref >39)
LDL Chol Calc (NIH): 133 mg/dL — ABNORMAL HIGH (ref 0–99)
Triglycerides: 154 mg/dL — ABNORMAL HIGH (ref 0–149)
VLDL Cholesterol Cal: 28 mg/dL (ref 5–40)

## 2021-09-29 LAB — GLUCOSE, RANDOM: Glucose: 91 mg/dL (ref 70–99)

## 2021-09-29 LAB — HEMOGLOBIN A1C
Est. average glucose Bld gHb Est-mCnc: 114 mg/dL
Hgb A1c MFr Bld: 5.6 % (ref 4.8–5.6)

## 2021-10-04 ENCOUNTER — Telehealth: Payer: Self-pay

## 2021-10-04 NOTE — Telephone Encounter (Signed)
Health coaching 2 ?  ?interpreterMerchant navy officer # [phone] ?  ?Labs- 202 cholesterol, 133 LDL cholesterol, 154 triglycerides, 41 HDL cholesterol, 5.6 hemoglobin A1C, 91 mean plasma glucose  Patient understands and is aware of her lab results. ?  ?Goals- Continue trying to practice a heart healthy diet. Reducing the amount of fried and fatty foods consumed. Increase lean protein and decrease red meat consumption. Increase daily fruit and vegetable intake. Try and get 20-30 minutes of exercise each day. ?  ?Navigation:  Patient is aware of 1 more health coaching sessions and a follow up. Will call patient with follow-up appointment information once appointment has been scheduled with PCP. ? ?Time-  12 minutes ?

## 2021-11-03 ENCOUNTER — Ambulatory Visit: Payer: No Typology Code available for payment source

## 2021-11-08 ENCOUNTER — Other Ambulatory Visit: Payer: Self-pay

## 2021-11-23 ENCOUNTER — Encounter: Payer: Self-pay | Admitting: Nurse Practitioner

## 2021-11-23 ENCOUNTER — Other Ambulatory Visit: Payer: Self-pay

## 2021-11-23 ENCOUNTER — Ambulatory Visit: Payer: Self-pay | Attending: Critical Care Medicine | Admitting: Nurse Practitioner

## 2021-11-23 VITALS — BP 127/81 | HR 62 | Ht 60.0 in | Wt 146.0 lb

## 2021-11-23 DIAGNOSIS — G43809 Other migraine, not intractable, without status migrainosus: Secondary | ICD-10-CM

## 2021-11-23 DIAGNOSIS — D72829 Elevated white blood cell count, unspecified: Secondary | ICD-10-CM

## 2021-11-23 DIAGNOSIS — I10 Essential (primary) hypertension: Secondary | ICD-10-CM

## 2021-11-23 DIAGNOSIS — E7800 Pure hypercholesterolemia, unspecified: Secondary | ICD-10-CM

## 2021-11-23 DIAGNOSIS — Z1211 Encounter for screening for malignant neoplasm of colon: Secondary | ICD-10-CM

## 2021-11-23 MED ORDER — CHLORTHALIDONE 25 MG PO TABS
25.0000 mg | ORAL_TABLET | Freq: Every day | ORAL | 1 refills | Status: DC
  Filled 2021-11-23 – 2021-12-28 (×2): qty 90, 90d supply, fill #0

## 2021-11-23 MED ORDER — SUMATRIPTAN SUCCINATE 50 MG PO TABS
50.0000 mg | ORAL_TABLET | ORAL | 6 refills | Status: DC | PRN
  Filled 2021-11-23: qty 10, 30d supply, fill #0

## 2021-11-23 NOTE — Progress Notes (Signed)
Refill on Imitrex for headaches

## 2021-11-23 NOTE — Progress Notes (Signed)
Assessment & Plan:  Heidi Rogers was seen today for hyperlipidemia.  Diagnoses and all orders for this visit:  Primary hypertension -     CMP14+EGFR  Pure hypercholesterolemia -     Lipid panel  Colon cancer screening -     Fecal occult blood, imunochemical([facility])  Leukocytosis, unspecified type -     CBC with Differential  Other migraine without status migrainosus, not intractable -     SUMAtriptan (IMITREX) 50 MG tablet; Take 1 tablet (50 mg total) by mouth every 2 (two) hours as needed for migraine. May repeat in 2 hours if headache persists or recurs.  Other orders -     chlorthalidone (HYGROTON) 25 MG tablet; Take 1 tablet (25 mg total) by mouth daily.    Patient has been counseled on age-appropriate routine health concerns for screening and prevention. These are reviewed and up-to-date. Referrals have been placed accordingly. Immunizations are up-to-date or declined.    Subjective:   Chief Complaint  Patient presents with   Hyperlipidemia   Hyperlipidemia Pertinent negatives include no chest pain, focal weakness, myalgias or shortness of breath.  Heidi Rogers 56 y.o. female presents to office today after being referred by the [facility] program for elevated cholesterol.  She has a history of hypertension and migraine headaches.  Overdue for eye exam and is currently uninsured.  VRI was used to communicate directly with patient for the entire encounter including providing detailed patient instructions.    Patient has been counseled on age-appropriate routine health concerns for screening and prevention. These are reviewed and up-to-date. Referrals have been placed accordingly. Immunizations are up-to-date or declined.     Pap smear: Up-to-date 09/23/2019 Colon cancer screening: Patient has not returned her stool.  That was given to her in October.  Will give  new kit today. Mammogram: Overdue and has not been scheduled.  She was given the phone number today  to the breast clinic for scheduling   Migraine headaches She is not feeling well today and states she has a migraine headache since the past few days and is out of her Imitrex which has been refilled today.   HTN Blood pressure is well controlled with chlorthalidone 25 mg daily.  She does not have a blood pressure monitoring device at home and therefore does not monitor her blood pressure. BP Readings from Last 3 Encounters:  11/23/21 127/81  09/28/21 114/78  09/07/21 122/80    Elevated lipid At this time ASCVD score does not indicate any need for lipid lowering agent.  However based on Framingham score at this time she would need to be started on lipid-lowering agent as risk score is greater than 7%. 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  She has never received her neuro-ophthalmology referral. She was referred to [facility] last October.  In April the referral coordinator reached out to be office and they stated they would mail patient an application.  However today after speaking to representative there was miscommunication and patient has to call their office to pay $100 over the phone with a debit or Credit card and then she will be able to fill out an application in their office which she is scheduled for an appointment.  I did explain this to the patient today and she was given the office number to call. Review of Systems  Constitutional:  Negative for fever, malaise/fatigue and weight loss.  HENT: Negative.  Negative for nosebleeds.   Eyes:  Negative for blurred vision, double vision and photophobia.        See HPI  Respiratory: Negative.  Negative for cough and shortness of breath.   Cardiovascular: Negative.  Negative for chest pain, palpitations and leg swelling.  Gastrointestinal: Negative.  Negative for heartburn, nausea and vomiting.  Musculoskeletal: Negative.  Negative for myalgias.  Neurological:  Positive for headaches. Negative for dizziness, focal weakness and seizures.  Psychiatric/Behavioral: Negative.  Negative for suicidal ideas.    Past Medical History:  Diagnosis Date   High cholesterol    Hypertension    Migraines     Past Surgical History:  Procedure Laterality Date   TUBAL LIGATION      Family History  Problem Relation Age of Onset   Migraines Neg Hx     Social History Reviewed with no changes to be made today.   Outpatient Medications Prior to Visit  Medication Sig Dispense Refill   chlorthalidone (HYGROTON) 25 MG tablet Take 1 tablet (25 mg total) by mouth daily. 90 tablet 1   meloxicam (MOBIC) 15 MG tablet Take 1 tablet (15 mg total) by mouth daily. (Patient not taking: Reported on 09/07/2021) 30 tablet 1   SUMAtriptan (IMITREX) 50 MG tablet Take 1 tablet (50 mg total) by mouth every 2 (two) hours as needed for migraine. May repeat in 2 hours if headache persists or recurs. (Patient not taking: Reported on 09/07/2021) 10 tablet 0   No facility-administered medications prior to visit.    No Known Allergies     Objective:    BP 127/81   Pulse 62   Ht 5' (1.524 m)   Wt 146 lb (66.2 kg)   LMP 02/01/2018   SpO2 98%   BMI 28.51 kg/m  Wt Readings from Last 3 Encounters:  11/23/21 146 lb (66.2 kg)  09/28/21 149 lb 1.6 oz (67.6 kg)  09/07/21 145 lb 14.4 oz (66.2 kg)    Physical Exam Vitals and nursing note reviewed.  Constitutional:      Appearance: She is well-developed.  HENT:     Head: Normocephalic and atraumatic.  Cardiovascular:     Rate and Rhythm: Normal rate and regular rhythm.     Heart sounds: Normal heart sounds. No murmur  heard.   No friction rub. No gallop.  Pulmonary:     Effort: Pulmonary effort is normal. No tachypnea or respiratory distress.     Breath sounds: Normal breath sounds. No decreased breath sounds, wheezing, rhonchi or rales.  Chest:     Chest wall: No tenderness.  Abdominal:     General: Bowel sounds are normal.     Palpations: Abdomen is soft.  Musculoskeletal:        General: Normal range of motion.     Cervical back: Normal range of motion.  Skin:    General: Skin is warm and dry.  Neurological:     Mental Status: She is alert and oriented to person, place, and time.  Coordination: Coordination normal.  Psychiatric:        Behavior: Behavior normal. Behavior is cooperative.        Thought Content: Thought content normal.        Judgment: Judgment normal.         Patient has been counseled extensively about nutrition and exercise as well as the importance of adherence with medications and regular follow-up. The patient was given clear instructions to go to ER or return to medical center if symptoms don't improve, worsen or new problems develop. The patient verbalized understanding.   Follow-up: Return in about 3 months (around 02/23/2022) for HTN/HPL.   Gildardo Pounds, FNP-BC [facility], [place]   11/23/2021, 9:56 AM

## 2021-11-24 ENCOUNTER — Other Ambulatory Visit: Payer: Self-pay | Admitting: Nurse Practitioner

## 2021-11-24 LAB — CBC WITH DIFFERENTIAL/PLATELET
Basophils Absolute: 0.1 10*3/uL (ref 0.0–0.2)
Basos: 1 %
EOS (ABSOLUTE): 0.1 10*3/uL (ref 0.0–0.4)
Eos: 2 %
Hematocrit: 42.8 % (ref 34.0–46.6)
Hemoglobin: 14.4 g/dL (ref 11.1–15.9)
Immature Grans (Abs): 0 10*3/uL (ref 0.0–0.1)
Immature Granulocytes: 0 %
Lymphocytes Absolute: 2 10*3/uL (ref 0.7–3.1)
Lymphs: 33 %
MCH: 28.3 pg (ref 26.6–33.0)
MCHC: 33.6 g/dL (ref 31.5–35.7)
MCV: 84 fL (ref 79–97)
Monocytes Absolute: 0.5 10*3/uL (ref 0.1–0.9)
Monocytes: 7 %
Neutrophils Absolute: 3.4 10*3/uL (ref 1.4–7.0)
Neutrophils: 57 %
Platelets: 268 10*3/uL (ref 150–450)
RBC: 5.08 x10E6/uL (ref 3.77–5.28)
RDW: 12.3 % (ref 11.7–15.4)
WBC: 6.1 10*3/uL (ref 3.4–10.8)

## 2021-11-24 LAB — CMP14+EGFR
ALT: 21 IU/L (ref 0–32)
AST: 16 IU/L (ref 0–40)
Albumin/Globulin Ratio: 1.8 (ref 1.2–2.2)
Albumin: 4.6 g/dL (ref 3.8–4.9)
Alkaline Phosphatase: 102 IU/L (ref 44–121)
BUN/Creatinine Ratio: 25 — ABNORMAL HIGH (ref 9–23)
BUN: 15 mg/dL (ref 6–24)
Bilirubin Total: 0.4 mg/dL (ref 0.0–1.2)
CO2: 22 mmol/L (ref 20–29)
Calcium: 9.6 mg/dL (ref 8.7–10.2)
Chloride: 104 mmol/L (ref 96–106)
Creatinine, Ser: 0.6 mg/dL (ref 0.57–1.00)
Globulin, Total: 2.6 g/dL (ref 1.5–4.5)
Glucose: 104 mg/dL — ABNORMAL HIGH (ref 70–99)
Potassium: 4 mmol/L (ref 3.5–5.2)
Sodium: 142 mmol/L (ref 134–144)
Total Protein: 7.2 g/dL (ref 6.0–8.5)
eGFR: 107 mL/min/{1.73_m2} (ref >59)

## 2021-11-24 LAB — LIPID PANEL
Chol/HDL Ratio: 5.3 ratio — ABNORMAL HIGH (ref 0.0–4.4)
Cholesterol, Total: 227 mg/dL — ABNORMAL HIGH (ref 100–199)
HDL: 43 mg/dL (ref >39)
LDL Chol Calc (NIH): 162 mg/dL — ABNORMAL HIGH (ref 0–99)
Triglycerides: 123 mg/dL (ref 0–149)
VLDL Cholesterol Cal: 22 mg/dL (ref 5–40)

## 2021-11-24 MED ORDER — ATORVASTATIN CALCIUM 10 MG PO TABS
10.0000 mg | ORAL_TABLET | Freq: Every day | ORAL | 3 refills | Status: DC
  Filled 2021-11-24: qty 90, 90d supply, fill #0

## 2021-11-25 ENCOUNTER — Other Ambulatory Visit: Payer: Self-pay

## 2021-11-29 ENCOUNTER — Other Ambulatory Visit: Payer: Self-pay

## 2021-12-28 ENCOUNTER — Other Ambulatory Visit: Payer: Self-pay

## 2022-01-04 ENCOUNTER — Ambulatory Visit: Payer: No Typology Code available for payment source | Admitting: Critical Care Medicine

## 2022-02-23 ENCOUNTER — Other Ambulatory Visit: Payer: Self-pay

## 2022-02-23 ENCOUNTER — Ambulatory Visit
Payer: No Typology Code available for payment source | Attending: Critical Care Medicine | Admitting: Critical Care Medicine

## 2022-02-23 ENCOUNTER — Encounter: Payer: Self-pay | Admitting: Critical Care Medicine

## 2022-02-23 VITALS — BP 115/78

## 2022-02-23 DIAGNOSIS — I10 Essential (primary) hypertension: Secondary | ICD-10-CM

## 2022-02-23 DIAGNOSIS — E7800 Pure hypercholesterolemia, unspecified: Secondary | ICD-10-CM

## 2022-02-23 DIAGNOSIS — M79672 Pain in left foot: Secondary | ICD-10-CM

## 2022-02-23 DIAGNOSIS — H538 Other visual disturbances: Secondary | ICD-10-CM

## 2022-02-23 DIAGNOSIS — M79671 Pain in right foot: Secondary | ICD-10-CM

## 2022-02-23 DIAGNOSIS — G43809 Other migraine, not intractable, without status migrainosus: Secondary | ICD-10-CM

## 2022-02-23 MED ORDER — ATORVASTATIN CALCIUM 10 MG PO TABS
10.0000 mg | ORAL_TABLET | Freq: Every day | ORAL | 3 refills | Status: AC
  Filled 2022-02-23: qty 90, 90d supply, fill #0

## 2022-02-23 MED ORDER — CHLORTHALIDONE 25 MG PO TABS
25.0000 mg | ORAL_TABLET | Freq: Every day | ORAL | 1 refills | Status: DC
  Filled 2022-02-23: qty 90, 90d supply, fill #0

## 2022-02-23 NOTE — Assessment & Plan Note (Signed)
Blurred vision has resolved we will follow-up in the office

## 2022-02-23 NOTE — Progress Notes (Signed)
Established Patient Office Visit  Subjective:  Patient ID: Heidi Rogers, female    DOB: 1965/10/03  Age: 56 y.o. MRN: [id number] Virtual Visit via Telephone Note  I connected with Heidi Rogers on 02/23/22 at  8:30 AM EDT by telephone and verified that I am speaking with the correct person using two identifiers.   Consent:  I discussed the limitations, risks, security and privacy concerns of performing an evaluation and management service by telephone and the availability of in person appointments. I also discussed with the patient that there may be a patient responsible charge related to this service. The patient expressed understanding and agreed to proceed.  Location of patient: Patient's at home  Location of provider: I am in my office  Persons participating in the televisit with the patient.   No one else on the call    History of Present Illness:   CC:  Primary care follow-up blood pressure follow-up  HPI  04/2021 Spanish interpreter achieved with Winona Legato #[id number] Heidi Rogers presents for primary care follow-up.  Patient continues to have migraine type headaches that occur nearly every day.  They are not intractable.  They are not relieved with ibuprofen.  She continues have loss of vision in the right eye.  She never received her neuro-ophthalmology referral.  She does not have a follow-up appointment with neurology.  On arrival blood pressures 133/76.  Note patient did see podiatry and had an ankle capsulitis bilaterally and was treated with steroids and nonsteroidal anti-inflammatory with marked improvement she also has an insert on her shoes now.  Patient has no other real complaints at this time.  She agreed to and received a flu vaccine on arrival.  8/24 This is a telephone visit the patient [name] was scheduled for in person 830 appointment when I called the patient she was not aware of this appointment.  She was last seen in May with Ms. Raul Del.   Visit was assisted by Spanish audio interpreter Verdis Frederickson [phone] [facility] interpreters.  At the last visit in May patient was determined to have persisting hypertension had her chlorthalidone reordered on the chlorthalidone she is consistently been less than 120/80.  She remains in this range at home.  Patient has not picked up her atorvastatin after 190-day fill because the pharmacy would not give her any more medication.  She has elevated LDL 160 without the atorvastatin.  Patient had a Pap smear in 2021 that was normal repeat Pap smear will be in 2024 she had a mammogram in 2022 which was normal she needs to have another 1 this year she missed the appointment she will call to reschedule.  Patient's not had any headaches since blood pressures been controlled and is no longer on the Imitrex.  She has no other complaints at this time.  Past Medical History:  Diagnosis Date   High cholesterol    Hypertension    Migraines     Past Surgical History:  Procedure Laterality Date   TUBAL LIGATION      Family History  Problem Relation Age of Onset   Migraines Neg Hx     Social History   Socioeconomic History   Marital status: Single    Spouse name: Not on file   Number of children: 5   Years of education: Not on file   Highest education level: 2nd grade  Occupational History   Not on file  Tobacco Use   Smoking status: Never  Smokeless tobacco: Never  Vaping Use   Vaping Use: Never used  Substance and Sexual Activity   Alcohol use: No   Drug use: Not Currently   Sexual activity: Not Currently    Birth control/protection: Surgical, Post-menopausal  Other Topics Concern   Not on file  Social History Narrative   Lives with her daughter   Right handed   Caffeine: 2 cups coffee/day   Social Determinants of Health   Financial Resource Strain: Not on file  Food Insecurity: Food Insecurity Present (09/28/2021)   Hunger Vital Sign    Worried About [place] in the Last  Year: Sometimes true    Ran Out of Food in the Last Year: Sometimes true  Transportation Needs: No Transportation Needs (09/28/2021)   PRAPARE - Hydrologist (Medical): No    Lack of Transportation (Non-Medical): No  Physical Activity: Not on file  Stress: Not on file  Social Connections: Not on file  Intimate Partner Violence: Not on file    Outpatient Medications Prior to Visit  Medication Sig Dispense Refill   atorvastatin (LIPITOR) 10 MG tablet Take 1 tablet (10 mg total) by mouth daily. 90 tablet 3   chlorthalidone (HYGROTON) 25 MG tablet Take 1 tablet (25 mg total) by mouth daily. 90 tablet 1   SUMAtriptan (IMITREX) 50 MG tablet Take 1 tablet (50 mg total) by mouth every 2 (two) hours as needed for migraine. May repeat in 2 hours if headache persists or recurs. 10 tablet 6   No facility-administered medications prior to visit.    No Known Allergies  ROS Review of Systems  Constitutional:  Negative for chills, diaphoresis and fever.  HENT:  Negative for congestion, hearing loss, nosebleeds, sore throat and tinnitus.   Eyes:  Negative for photophobia, redness and visual disturbance.  Respiratory:  Negative for cough, shortness of breath, wheezing and stridor.   Cardiovascular:  Negative for chest pain, palpitations and leg swelling.  Gastrointestinal:  Negative for abdominal pain, blood in stool, constipation, diarrhea, nausea and vomiting.  Endocrine: Negative for polydipsia.  Genitourinary:  Negative for dysuria, flank pain, frequency, hematuria and urgency.  Musculoskeletal:  Negative for back pain, myalgias and neck pain.  Skin:  Negative for rash.  Allergic/Immunologic: Negative for environmental allergies.  Neurological:  Negative for dizziness, tremors, seizures, weakness and headaches.  Hematological:  Does not bruise/bleed easily.  Psychiatric/Behavioral: Negative.  Negative for suicidal ideas. The patient is not nervous/anxious.        Objective:    No exam this was a phone visit  LMP 02/01/2018  Wt Readings from Last 3 Encounters:  11/23/21 146 lb (66.2 kg)  09/28/21 149 lb 1.6 oz (67.6 kg)  09/07/21 145 lb 14.4 oz (66.2 kg)     Health Maintenance Due  Topic Date Due   COLON CANCER SCREENING ANNUAL FOBT  Never done   INFLUENZA VACCINE  01/31/2022    There are no preventive care reminders to display for this patient.  Lab Results  Component Value Date   TSH 2.150 08/04/2020   Lab Results  Component Value Date   WBC 6.1 11/23/2021   HGB 14.4 11/23/2021   HCT 42.8 11/23/2021   MCV 84 11/23/2021   PLT 268 11/23/2021   Lab Results  Component Value Date   NA 142 11/23/2021   K 4.0 11/23/2021   CO2 22 11/23/2021   GLUCOSE 104 (H) 11/23/2021   BUN 15 11/23/2021   CREATININE 0.60  11/23/2021   BILITOT 0.4 11/23/2021   ALKPHOS 102 11/23/2021   AST 16 11/23/2021   ALT 21 11/23/2021   PROT 7.2 11/23/2021   ALBUMIN 4.6 11/23/2021   CALCIUM 9.6 11/23/2021   ANIONGAP 6 05/04/2018   EGFR 107 11/23/2021   Lab Results  Component Value Date   CHOL 227 (H) 11/23/2021   Lab Results  Component Value Date   HDL 43 11/23/2021   Lab Results  Component Value Date   LDLCALC 162 (H) 11/23/2021   Lab Results  Component Value Date   TRIG 123 11/23/2021   Lab Results  Component Value Date   CHOLHDL 5.3 (H) 11/23/2021   Lab Results  Component Value Date   HGBA1C 5.6 09/28/2021      Assessment & Plan:   Problem List Items Addressed This Visit       Cardiovascular and Mediastinum   Primary hypertension    Blood pressure well controlled continue chlorthalidone refills given Labs in May normal      RESOLVED: Other migraine without status migrainosus, not intractable    Headaches resolved likely from hypertension will not refill Imitrex        Other   Elevated cholesterol    Needs to refill atorvastatin refill sent we will monitor      Blurred vision, right eye    Blurred vision has  resolved we will follow-up in the office      RESOLVED: Foot pain, bilateral    Foot pain has resolved     Patient needs to process colon cancer screening kit she was reluctant because she was afraid of the charge it would generate I told her it would be very low to no cost she will process and bring this in Follow Up Instructions:  Patient knows to bring in her colon cancer screening kit refills on cholesterol and blood pressure pills sent to pharmacy and she will be called for an in person visit this fall I discussed the assessment and treatment plan with the patient. The patient was provided an opportunity to ask questions and all were answered. The patient agreed with the plan and demonstrated an understanding of the instructions.   The patient was advised to call back or seek an in-person evaluation if the symptoms worsen or if the condition fails to improve as anticipated.  I provided 28 minutes of non-face-to-face time during this encounter  including  median intraservice time , review of notes, labs, imaging, medications  and explaining diagnosis and management to the patient .    Asencion Noble, MD

## 2022-02-23 NOTE — Assessment & Plan Note (Signed)
Needs to refill atorvastatin refill sent we will monitor

## 2022-02-23 NOTE — Assessment & Plan Note (Signed)
Foot pain has resolved

## 2022-02-23 NOTE — Assessment & Plan Note (Signed)
Blood pressure well controlled continue chlorthalidone refills given Labs in May normal

## 2022-02-23 NOTE — Assessment & Plan Note (Signed)
Headaches resolved likely from hypertension will not refill Imitrex

## 2022-03-20 ENCOUNTER — Telehealth: Payer: Self-pay

## 2022-03-20 NOTE — Telephone Encounter (Addendum)
Health Coaching 3  interpreter- Rudene Anda, [facility]   Goals- Patient has been trying to practice heart healthy diet. Patient has been checking BP at home and has been taking BP medication daily. Patient has not picked cholesterol medication up from the pharmacy recently. Encouraged patient to pick up medication from pharmacy soon. Patient is still trying to get appointment scheduled for work-up for ongoing issues with migraines and blurred vision.   New goal-   Barrier to reaching goal-    Strategies to overcome-    Navigation:  Patient is aware of  a follow up session.   Time-  10 minutes

## 2022-04-20 ENCOUNTER — Ambulatory Visit
Admission: RE | Admit: 2022-04-20 | Discharge: 2022-04-20 | Disposition: A | Discharge status: Home / Self Care | Payer: No Typology Code available for payment source | Source: Ambulatory Visit | Attending: Obstetrics and Gynecology | Admitting: Obstetrics and Gynecology

## 2022-04-20 ENCOUNTER — Ambulatory Visit: Payer: Self-pay | Admitting: Hematology and Oncology

## 2022-04-20 VITALS — BP 122/80 | Wt 144.5 lb

## 2022-04-20 DIAGNOSIS — Z1211 Encounter for screening for malignant neoplasm of colon: Secondary | ICD-10-CM

## 2022-04-20 DIAGNOSIS — Z1231 Encounter for screening mammogram for malignant neoplasm of breast: Secondary | ICD-10-CM

## 2022-04-20 NOTE — Patient Instructions (Signed)
Taught Magda Paganini taught self breast awareness. Patient did not need a Pap smear today due to last Pap smear was in 2021 per patient. Let her know [facility] will cover Pap smears every 5 years unless has a history of abnormal Pap smears. Referred patient to the [facility] for screening mammogram. Appointment scheduled for 04/20/22. Patient aware of appointment and will be there. Let patient know will follow up with her within the next couple weeks with results. Brianca Fortenberry verbalized understanding.  Melodye Ped, NP 3:11 PM

## 2022-04-20 NOTE — Progress Notes (Signed)
Heidi Rogers is a 56 y.o. female who presents to [facility] clinic today with no complaints.    Pap Smear: Pap not smear completed today. Last Pap smear was 2021 at [facility] clinic and was normal. Per patient has no history of an abnormal Pap smear. Last Pap smear result is not available in Epic.   Physical exam: Breasts Breasts symmetrical. No skin abnormalities bilateral breasts. No nipple retraction bilateral breasts. No nipple discharge bilateral breasts. No lymphadenopathy. No lumps palpated bilateral breasts. 0.5 cm red lesion noted to left breast. MS DIGITAL SCREENING TOMO BILATERAL  Result Date: 10/01/2020 CLINICAL DATA:  Screening. EXAM: DIGITAL SCREENING BILATERAL MAMMOGRAM WITH TOMOSYNTHESIS AND CAD TECHNIQUE: Bilateral screening digital craniocaudal and mediolateral oblique mammograms were obtained. Bilateral screening digital breast tomosynthesis was performed. The images were evaluated with computer-aided detection. COMPARISON:  Previous exam(s). ACR Breast Density Category c: The breast tissue is heterogeneously dense, which may obscure small masses. FINDINGS: There are no findings suspicious for malignancy. The images were evaluated with computer-aided detection. IMPRESSION: No mammographic evidence of malignancy. A result letter of this screening mammogram will be mailed directly to the patient. RECOMMENDATION: Screening mammogram in one year. (Code:SM-B-01Y) BI-RADS CATEGORY  1: Negative. Electronically Signed   By: Kristopher Oppenheim M.D.   On: 10/01/2020 15:25   MS DIGITAL SCREENING TOMO BILATERAL  Result Date: 09/23/2019 CLINICAL DATA:  Screening. EXAM: DIGITAL SCREENING BILATERAL MAMMOGRAM WITH TOMO AND CAD COMPARISON:  Previous exam(s). ACR Breast Density Category c: The breast tissue is heterogeneously dense, which may obscure small masses. FINDINGS: There are no findings suspicious for malignancy. Images were processed with CAD. IMPRESSION: No mammographic evidence of malignancy.  A result letter of this screening mammogram will be mailed directly to the patient. RECOMMENDATION: Screening mammogram in one year. (Code:SM-B-01Y) BI-RADS CATEGORY  1: Negative. Electronically Signed   By: Ammie Ferrier M.D.   On: 09/23/2019 15:16       Pelvic/Bimanual Pap is not indicated today    Smoking History: Patient has never smoked and was not referred to quit line.    Patient Navigation: Patient education provided. Access to services provided for patient through [place] interpreter provided. No transportation provided   Colorectal Cancer Screening: Per patient has never had colonoscopy completed No complaints today. FIT test given.   Breast and Cervical Cancer Risk Assessment: Patient does not have family history of breast cancer, known genetic mutations, or radiation treatment to the chest before age 66. Patient does not have history of cervical dysplasia, immunocompromised, or DES exposure in-utero.  Risk Assessment     Risk Scores       04/20/2022 09/30/2020   Last edited by: Claretha Cooper, CMA Royston Bake, CMA   5-year risk: 0.5 % 0.5 %   Lifetime risk: 3.8 % 4 %            A: [facility] exam without pap smear No complaints with benign exam. 0.5 cm new red lesion noted to left breast. Gave list of local dermatologists.   P: Referred patient to the [facility] for a screening mammogram. Appointment scheduled 04/20/22.  Dayton Scrape A, NP 04/20/2022 3:07 PM

## 2022-04-26 ENCOUNTER — Other Ambulatory Visit: Payer: Self-pay | Admitting: Obstetrics and Gynecology

## 2022-04-26 DIAGNOSIS — R928 Other abnormal and inconclusive findings on diagnostic imaging of breast: Secondary | ICD-10-CM

## 2022-05-11 ENCOUNTER — Other Ambulatory Visit: Payer: No Typology Code available for payment source

## 2022-05-31 ENCOUNTER — Other Ambulatory Visit: Payer: No Typology Code available for payment source

## 2022-06-05 NOTE — Progress Notes (Deleted)
Established Patient Office Visit  Subjective:  Patient ID: Heidi Rogers, female    DOB: December 29, 1965  Age: 56 y.o. MRN: [id number]  History of Present Illness:   CC:  Primary care follow-up blood pressure follow-up  HPI  04/2021 Spanish interpreter achieved with Winona Legato #[id number] Magda Paganini presents for primary care follow-up.  Patient continues to have migraine type headaches that occur nearly every day.  They are not intractable.  They are not relieved with ibuprofen.  She continues have loss of vision in the right eye.  She never received her neuro-ophthalmology referral.  She does not have a follow-up appointment with neurology.  On arrival blood pressures 133/76.  Note patient did see podiatry and had an ankle capsulitis bilaterally and was treated with steroids and nonsteroidal anti-inflammatory with marked improvement she also has an insert on her shoes now.  Patient has no other real complaints at this time.  She agreed to and received a flu vaccine on arrival.  8/24 This is a telephone visit the patient [name] was scheduled for in person 830 appointment when I called the patient she was not aware of this appointment.  She was last seen in May with Ms. Raul Del.  Visit was assisted by Spanish audio interpreter Verdis Frederickson [phone] [facility] interpreters.  At the last visit in May patient was determined to have persisting hypertension had her chlorthalidone reordered on the chlorthalidone she is consistently been less than 120/80.  She remains in this range at home.  Patient has not picked up her atorvastatin after 190-day fill because the pharmacy would not give her any more medication.  She has elevated LDL 160 without the atorvastatin.  Patient had a Pap smear in 2021 that was normal repeat Pap smear will be in 2024 she had a mammogram in 2022 which was normal she needs to have another 1 this year she missed the appointment she will call to reschedule.  Patient's not had any  headaches since blood pressures been controlled and is no longer on the Imitrex.  She has no other complaints at this time.  12/5 Primary hypertension   Blood pressure well controlled continue chlorthalidone refills given Labs in May normal     RESOLVED: Other migraine without status migrainosus, not intractable   Headaches resolved likely from hypertension will not refill Imitrex     Other  Elevated cholesterol   Needs to refill atorvastatin refill sent we will monitor     Blurred vision, right eye   Blurred vision has resolved we will follow-up in the office     RESOLVED: Foot pain, bilateral   Foot pain has resolved      Past Medical History:  Diagnosis Date   High cholesterol    Hypertension    Migraines     Past Surgical History:  Procedure Laterality Date   TUBAL LIGATION      Family History  Problem Relation Age of Onset   Migraines Neg Hx     Social History   Socioeconomic History   Marital status: Single    Spouse name: Not on file   Number of children: 5   Years of education: Not on file   Highest education level: 2nd grade  Occupational History   Not on file  Tobacco Use   Smoking status: Never   Smokeless tobacco: Never  Vaping Use   Vaping Use: Never used  Substance and Sexual Activity   Alcohol use: No   Drug use: Not  Currently   Sexual activity: Not Currently    Birth control/protection: Surgical, Post-menopausal  Other Topics Concern   Not on file  Social History Narrative   Lives with her daughter   Right handed   Caffeine: 2 cups coffee/day   Social Determinants of Health   Financial Resource Strain: Not on file  Food Insecurity: Food Insecurity Present (04/20/2022)   Hunger Vital Sign    Worried About Running Out of Food in the Last Year: Sometimes true    Ran Out of Food in the Last Year: Sometimes true  Transportation Needs: No Transportation Needs (04/20/2022)   PRAPARE - Hydrologist  (Medical): No    Lack of Transportation (Non-Medical): No  Physical Activity: Not on file  Stress: Not on file  Social Connections: Not on file  Intimate Partner Violence: Not on file    Outpatient Medications Prior to Visit  Medication Sig Dispense Refill   atorvastatin (LIPITOR) 10 MG tablet Take 1 tablet (10 mg total) by mouth daily. 90 tablet 3   chlorthalidone (HYGROTON) 25 MG tablet Take 1 tablet (25 mg total) by mouth daily. 90 tablet 1   No facility-administered medications prior to visit.    No Known Allergies  ROS Review of Systems  Constitutional:  Negative for chills, diaphoresis and fever.  HENT:  Negative for congestion, hearing loss, nosebleeds, sore throat and tinnitus.   Eyes:  Negative for photophobia, redness and visual disturbance.  Respiratory:  Negative for cough, shortness of breath, wheezing and stridor.   Cardiovascular:  Negative for chest pain, palpitations and leg swelling.  Gastrointestinal:  Negative for abdominal pain, blood in stool, constipation, diarrhea, nausea and vomiting.  Endocrine: Negative for polydipsia.  Genitourinary:  Negative for dysuria, flank pain, frequency, hematuria and urgency.  Musculoskeletal:  Negative for back pain, myalgias and neck pain.  Skin:  Negative for rash.  Allergic/Immunologic: Negative for environmental allergies.  Neurological:  Negative for dizziness, tremors, seizures, weakness and headaches.  Hematological:  Does not bruise/bleed easily.  Psychiatric/Behavioral: Negative.  Negative for suicidal ideas. The patient is not nervous/anxious.       Objective:    No exam this was a phone visit  LMP 02/01/2018  Wt Readings from Last 3 Encounters:  04/20/22 144 lb 8 oz (65.5 kg)  11/23/21 146 lb (66.2 kg)  09/28/21 149 lb 1.6 oz (67.6 kg)     Health Maintenance Due  Topic Date Due   COLON CANCER SCREENING ANNUAL FOBT  Never done   INFLUENZA VACCINE  01/31/2022    There are no preventive care  reminders to display for this patient.  Lab Results  Component Value Date   TSH 2.150 08/04/2020   Lab Results  Component Value Date   WBC 6.1 11/23/2021   HGB 14.4 11/23/2021   HCT 42.8 11/23/2021   MCV 84 11/23/2021   PLT 268 11/23/2021   Lab Results  Component Value Date   NA 142 11/23/2021   K 4.0 11/23/2021   CO2 22 11/23/2021   GLUCOSE 104 (H) 11/23/2021   BUN 15 11/23/2021   CREATININE 0.60 11/23/2021   BILITOT 0.4 11/23/2021   ALKPHOS 102 11/23/2021   AST 16 11/23/2021   ALT 21 11/23/2021   PROT 7.2 11/23/2021   ALBUMIN 4.6 11/23/2021   CALCIUM 9.6 11/23/2021   ANIONGAP 6 05/04/2018   EGFR 107 11/23/2021   Lab Results  Component Value Date   CHOL 227 (H) 11/23/2021   Lab  Results  Component Value Date   HDL 43 11/23/2021   Lab Results  Component Value Date   LDLCALC 162 (H) 11/23/2021   Lab Results  Component Value Date   TRIG 123 11/23/2021   Lab Results  Component Value Date   CHOLHDL 5.3 (H) 11/23/2021   Lab Results  Component Value Date   HGBA1C 5.6 09/28/2021      Assessment & Plan:   Problem List Items Addressed This Visit   None Patient needs to process colon cancer screening kit she was reluctant because she was afraid of the charge it would generate I told her it would be very low to no cost she will process and bring this in Follow Up Instructions:  Patient knows to bring in her colon cancer screening kit refills on cholesterol and blood pressure pills sent to pharmacy and she will be called for an in person visit this fall I discussed the assessment and treatment plan with the patient. The patient was provided an opportunity to ask questions and all were answered. The patient agreed with the plan and demonstrated an understanding of the instructions.   The patient was advised to call back or seek an in-person evaluation if the symptoms worsen or if the condition fails to improve as anticipated.  I provided 28 minutes of  non-face-to-face time during this encounter  including  median intraservice time , review of notes, labs, imaging, medications  and explaining diagnosis and management to the patient .    Asencion Noble, MD

## 2022-06-06 ENCOUNTER — Ambulatory Visit: Payer: Self-pay | Attending: Critical Care Medicine | Admitting: Critical Care Medicine

## 2022-06-21 ENCOUNTER — Ambulatory Visit
Admission: RE | Admit: 2022-06-21 | Discharge: 2022-06-21 | Disposition: A | Discharge status: Home / Self Care | Payer: No Typology Code available for payment source | Source: Ambulatory Visit | Attending: Obstetrics and Gynecology | Admitting: Obstetrics and Gynecology

## 2022-06-21 ENCOUNTER — Other Ambulatory Visit: Payer: Self-pay | Admitting: Obstetrics and Gynecology

## 2022-06-21 DIAGNOSIS — N6310 Unspecified lump in the right breast, unspecified quadrant: Secondary | ICD-10-CM

## 2022-06-21 DIAGNOSIS — R928 Other abnormal and inconclusive findings on diagnostic imaging of breast: Secondary | ICD-10-CM

## 2022-10-25 ENCOUNTER — Other Ambulatory Visit: Payer: Self-pay

## 2022-10-25 MED ORDER — CETIRIZINE HCL 10 MG PO TABS
10.0000 mg | ORAL_TABLET | Freq: Every day | ORAL | 3 refills | Status: AC
  Filled 2022-10-25: qty 90, 90d supply, fill #0

## 2022-10-25 MED ORDER — CHLORTHALIDONE 25 MG PO TABS
25.0000 mg | ORAL_TABLET | Freq: Every day | ORAL | 3 refills | Status: AC
  Filled 2022-10-25: qty 90, 90d supply, fill #0
  Filled 2023-02-27: qty 30, 30d supply, fill #1
  Filled 2023-09-19: qty 30, 30d supply, fill #2

## 2022-10-25 MED ORDER — EXCEDRIN MIGRAINE 250-250-65 MG PO TABS: 1.0000 | ORAL_TABLET | Freq: Four times a day (QID) | ORAL | 99 refills | Status: AC

## 2022-10-26 ENCOUNTER — Other Ambulatory Visit: Payer: Self-pay

## 2022-11-15 ENCOUNTER — Other Ambulatory Visit: Payer: No Typology Code available for payment source

## 2022-12-27 ENCOUNTER — Other Ambulatory Visit: Payer: No Typology Code available for payment source

## 2023-01-10 ENCOUNTER — Ambulatory Visit: Payer: No Typology Code available for payment source

## 2023-01-10 ENCOUNTER — Ambulatory Visit
Admission: RE | Admit: 2023-01-10 | Discharge: 2023-01-10 | Disposition: A | Discharge status: Home / Self Care | Payer: No Typology Code available for payment source | Source: Ambulatory Visit | Attending: Obstetrics and Gynecology | Admitting: Obstetrics and Gynecology

## 2023-01-10 DIAGNOSIS — N6310 Unspecified lump in the right breast, unspecified quadrant: Secondary | ICD-10-CM

## 2023-01-10 DIAGNOSIS — R928 Other abnormal and inconclusive findings on diagnostic imaging of breast: Secondary | ICD-10-CM

## 2023-02-07 ENCOUNTER — Other Ambulatory Visit: Payer: Self-pay

## 2023-02-07 MED ORDER — HYDROCORTISONE (PERIANAL) 1 % EX CREA
1.0000 | TOPICAL_CREAM | Freq: Two times a day (BID) | CUTANEOUS | 2 refills | Status: AC
  Filled 2023-02-07: qty 28.4, 15d supply, fill #0

## 2023-02-07 MED ORDER — POLYETHYLENE GLYCOL 3350 17 GM/SCOOP PO POWD
17.0000 g | Freq: Every day | ORAL | 2 refills | Status: AC | PRN
  Filled 2023-02-07: qty 238, 14d supply, fill #0

## 2023-02-08 ENCOUNTER — Other Ambulatory Visit: Payer: Self-pay

## 2023-02-09 ENCOUNTER — Other Ambulatory Visit: Payer: Self-pay

## 2023-02-12 ENCOUNTER — Other Ambulatory Visit: Payer: Self-pay

## 2023-02-16 ENCOUNTER — Other Ambulatory Visit: Payer: Self-pay

## 2023-02-27 ENCOUNTER — Other Ambulatory Visit: Payer: Self-pay

## 2023-02-27 MED ORDER — PREPARATION H 1-0.25-14.4-15 % EX CREA: TOPICAL_CREAM | CUTANEOUS | 2 refills | Status: AC

## 2023-02-27 MED ORDER — TRIAMCINOLONE ACETONIDE 0.1 % EX LOTN
TOPICAL_LOTION | CUTANEOUS | 1 refills | Status: AC
  Filled 2023-02-27: qty 60, 25d supply, fill #0

## 2023-02-27 MED ORDER — FLUCONAZOLE 150 MG PO TABS
150.0000 mg | ORAL_TABLET | Freq: Once | ORAL | 0 refills | Status: AC
  Filled 2023-02-27: qty 1, 1d supply, fill #0

## 2023-02-27 MED ORDER — NITROFURANTOIN MONOHYD MACRO 100 MG PO CAPS
100.0000 mg | ORAL_CAPSULE | Freq: Two times a day (BID) | ORAL | 0 refills | Status: DC
  Filled 2023-02-27: qty 14, 7d supply, fill #0

## 2023-03-28 ENCOUNTER — Other Ambulatory Visit: Payer: Self-pay

## 2023-03-28 DIAGNOSIS — Z1231 Encounter for screening mammogram for malignant neoplasm of breast: Secondary | ICD-10-CM

## 2023-05-24 ENCOUNTER — Inpatient Hospital Stay
Admission: RE | Admit: 2023-05-24 | Discharge: 2023-05-24 | Disposition: A | Discharge status: Home / Self Care | Payer: No Typology Code available for payment source | Source: Ambulatory Visit | Attending: Obstetrics and Gynecology | Admitting: Obstetrics and Gynecology

## 2023-05-24 ENCOUNTER — Ambulatory Visit: Payer: Self-pay | Admitting: Hematology and Oncology

## 2023-05-24 VITALS — BP 148/72 | Wt 143.0 lb

## 2023-05-24 DIAGNOSIS — Z1231 Encounter for screening mammogram for malignant neoplasm of breast: Secondary | ICD-10-CM

## 2023-05-24 DIAGNOSIS — Z1211 Encounter for screening for malignant neoplasm of colon: Secondary | ICD-10-CM

## 2023-05-24 NOTE — Patient Instructions (Signed)
Taught Heidi Rogers about self breast awareness and gave educational materials to take home. Patient did not need a Pap smear today due to last Pap smear was in 09/23/2019 per patient.  Let her know [facility] will cover Pap smears every 5 years unless has a history of abnormal Pap smears. Referred patient to the [facility] for screening mammogram. Appointment scheduled for 05/24/2023. Patient aware of appointment and will be there. Let patient know will follow up with her within the next couple weeks with results. Heidi Rogers verbalized understanding.  Pascal Lux, NP 11:28 AM

## 2023-05-24 NOTE — Progress Notes (Signed)
[name] is a 57 y.o. female who presents to [facility] clinic today with no complaints.    Pap Smear: Pap not smear completed today. Last Pap smear was 09/23/2019 and was normal. Per patient has no history of an abnormal Pap smear. Last Pap smear result is available in Epic.   Physical exam: Breasts Breasts symmetrical. No skin abnormalities bilateral breasts. No nipple retraction bilateral breasts. No nipple discharge bilateral breasts. No lymphadenopathy. No lumps palpated bilateral breasts.     MM DIAG BREAST TOMO UNI RIGHT  Result Date: 01/10/2023 CLINICAL DATA:  Follow-up for probably benign mass in the RIGHT breast. This probably benign finding was initially identified in October of 2023. EXAM: DIGITAL DIAGNOSTIC UNILATERAL RIGHT MAMMOGRAM WITH TOMOSYNTHESIS AND CAD TECHNIQUE: Right digital diagnostic mammography and breast tomosynthesis was performed. The images were evaluated with computer-aided detection. COMPARISON:  Previous exams including diagnostic mammogram and ultrasound dated 06/21/2022. ACR Breast Density Category c: The breasts are heterogeneously dense, which may obscure small masses. FINDINGS: The previously identified mass within the posterior RIGHT breast has resolved, confirming benignity, presumably a resolved cyst. There are no new dominant masses, suspicious calcifications or secondary signs of malignancy within the RIGHT breast. IMPRESSION: No evidence of malignancy. Patient may return to routine annual bilateral screening mammogram schedule. RECOMMENDATION: Annual screening mammograms. Next bilateral screening mammogram will be due in late October of 2024. I have discussed the findings and recommendations with the patient. If applicable, a reminder letter will be sent to the patient regarding the next appointment. BI-RADS CATEGORY  1: Negative. Electronically Signed   By: Bary Richard M.D.   On: 01/10/2023 10:11  MM DIAG BREAST TOMO UNI RIGHT  Result Date:  06/21/2022 CLINICAL DATA:  57 year old female recalled from screening mammogram dated 04/20/2022 for a possible right breast asymmetry. EXAM: DIGITAL DIAGNOSTIC UNILATERAL RIGHT MAMMOGRAM WITH TOMOSYNTHESIS; ULTRASOUND RIGHT BREAST LIMITED TECHNIQUE: Right digital diagnostic mammography and breast tomosynthesis was performed.; Targeted ultrasound examination of the right breast was performed COMPARISON:  Previous exam(s). ACR Breast Density Category c: The breast tissue is heterogeneously dense, which may obscure small masses. FINDINGS: There is a persistent focal asymmetry with possible reniform shape in the central upper right breast at posterior depth. Further evaluation with ultrasound was performed. Targeted ultrasound is performed, showing a hypoechoic mass with internal fat density at the 12 o'clock position 7 cm from the nipple. It measures 6 x 5 x 3 mm with peripheral vascularity. The mass runs adjacent to a blood vessel and likely corresponds with the mammographic finding. The appearance is most suggestive of a small intramammary lymph node. IMPRESSION: Probably benign right breast mass which may represent a small intramammary lymph node. Recommend short-term imaging follow-up. RECOMMENDATION: Diagnostic right breast mammogram and ultrasound in 6 months. I have discussed the findings and recommendations with the patient. If applicable, a reminder letter will be sent to the patient regarding the next appointment. BI-RADS CATEGORY  3: Probably benign. Electronically Signed   By: Sande Brothers M.D.   On: 06/21/2022 09:44  MS DIGITAL SCREENING TOMO BILATERAL  Result Date: 04/25/2022 CLINICAL DATA:  Screening. EXAM: DIGITAL SCREENING BILATERAL MAMMOGRAM WITH TOMOSYNTHESIS AND CAD TECHNIQUE: Bilateral screening digital craniocaudal and mediolateral oblique mammograms were obtained. Bilateral screening digital breast tomosynthesis was performed. The images were evaluated with computer-aided detection.  COMPARISON:  Previous exam(s). ACR Breast Density Category c: The breast tissue is heterogeneously dense, which may obscure small masses. FINDINGS: In the right breast, a possible asymmetry warrants  further evaluation. In the left breast, no findings suspicious for malignancy. IMPRESSION: Further evaluation is suggested for possible asymmetry in the right breast. RECOMMENDATION: Diagnostic mammogram and possibly ultrasound of the right breast. (Code:FI-R-11M) The patient will be contacted regarding the findings, and additional imaging will be scheduled. BI-RADS CATEGORY  0: Incomplete. Need additional imaging evaluation and/or prior mammograms for comparison. Electronically Signed   By: Jacob Moores M.D.   On: 04/25/2022 08:23   MS DIGITAL SCREENING TOMO BILATERAL  Result Date: 10/01/2020 CLINICAL DATA:  Screening. EXAM: DIGITAL SCREENING BILATERAL MAMMOGRAM WITH TOMOSYNTHESIS AND CAD TECHNIQUE: Bilateral screening digital craniocaudal and mediolateral oblique mammograms were obtained. Bilateral screening digital breast tomosynthesis was performed. The images were evaluated with computer-aided detection. COMPARISON:  Previous exam(s). ACR Breast Density Category c: The breast tissue is heterogeneously dense, which may obscure small masses. FINDINGS: There are no findings suspicious for malignancy. The images were evaluated with computer-aided detection. IMPRESSION: No mammographic evidence of malignancy. A result letter of this screening mammogram will be mailed directly to the patient. RECOMMENDATION: Screening mammogram in one year. (Code:[id number]-01Y) BI-RADS CATEGORY  1: Negative. Electronically Signed   By: Sande Brothers M.D.   On: 10/01/2020 15:25   MS DIGITAL SCREENING TOMO BILATERAL  Result Date: 09/23/2019 CLINICAL DATA:  Screening. EXAM: DIGITAL SCREENING BILATERAL MAMMOGRAM WITH TOMO AND CAD COMPARISON:  Previous exam(s). ACR Breast Density Category c: The breast tissue is heterogeneously dense,  which may obscure small masses. FINDINGS: There are no findings suspicious for malignancy. Images were processed with CAD. IMPRESSION: No mammographic evidence of malignancy. A result letter of this screening mammogram will be mailed directly to the patient. RECOMMENDATION: Screening mammogram in one year. (Code:[id number]-B-01Y) BI-RADS CATEGORY  1: Negative. Electronically Signed   By: Frederico Hamman M.D.   On: 09/23/2019 15:16      Pelvic/Bimanual Pap is not indicated today    Smoking History: Patient has never smoked and was not referred to quit line.    Patient Navigation: Patient education provided. Access to services provided for patient through [facility] program. [name] interpreter provided. No transportation provided   Colorectal Cancer Screening: Per patient has never had colonoscopy completed No complaints today. FIT test given.    Breast and Cervical Cancer Risk Assessment: Patient does not have family history of breast cancer, known genetic mutations, or radiation treatment to the chest before age 84. Patient does not have history of cervical dysplasia, immunocompromised, or DES exposure in-utero.  Risk Scores as of Encounter on 05/24/2023     Dondra Spry           5-year 0.4%   Lifetime 2.91%            Last calculated by Caprice Red, CMA on 05/24/2023 at 10:55 AM        A: [facility] exam without pap smear No complaints with benign exam.  P: Referred patient to the [facility] for a screening mammogram. Appointment scheduled 05/24/2023.  Pascal Lux, NP 05/24/2023 11:26 AM

## 2023-09-19 ENCOUNTER — Other Ambulatory Visit: Payer: Self-pay

## 2024-03-14 ENCOUNTER — Other Ambulatory Visit: Payer: Self-pay

## 2024-04-21 ENCOUNTER — Other Ambulatory Visit: Payer: Self-pay

## 2024-04-24 ENCOUNTER — Other Ambulatory Visit: Payer: Self-pay

## 2024-04-24 MED ORDER — LISINOPRIL 10 MG PO TABS
10.0000 mg | ORAL_TABLET | Freq: Every day | ORAL | 1 refills | Status: AC
  Filled 2024-04-24: qty 30, 30d supply, fill #0

## 2024-04-24 MED ORDER — ACETAMINOPHEN 500 MG PO TABS
500.0000 mg | ORAL_TABLET | ORAL | 1 refills | Status: AC | PRN
  Filled 2024-04-24: qty 100, 17d supply, fill #0
